# Patient Record
Sex: Male | Born: 1945 | Race: Black or African American | Hispanic: No | Marital: Single | Smoking: Never smoker
Health system: Southern US, Community
[De-identification: ages and names within clinical notes are randomized; demographics above are authoritative.]

## PROBLEM LIST (undated history)

## (undated) DIAGNOSIS — I10 Essential (primary) hypertension: Secondary | ICD-10-CM

## (undated) DIAGNOSIS — Z87442 Personal history of urinary calculi: Secondary | ICD-10-CM

## (undated) DIAGNOSIS — R5381 Other malaise: Secondary | ICD-10-CM

## (undated) DIAGNOSIS — R5383 Other fatigue: Secondary | ICD-10-CM

## (undated) DIAGNOSIS — D170 Benign lipomatous neoplasm of skin and subcutaneous tissue of head, face and neck: Secondary | ICD-10-CM

## (undated) DIAGNOSIS — K635 Polyp of colon: Secondary | ICD-10-CM

## (undated) DIAGNOSIS — M1990 Unspecified osteoarthritis, unspecified site: Secondary | ICD-10-CM

## (undated) DIAGNOSIS — R972 Elevated prostate specific antigen [PSA]: Secondary | ICD-10-CM

## (undated) DIAGNOSIS — N400 Enlarged prostate without lower urinary tract symptoms: Secondary | ICD-10-CM

## (undated) DIAGNOSIS — H269 Unspecified cataract: Secondary | ICD-10-CM

## (undated) DIAGNOSIS — D369 Benign neoplasm, unspecified site: Secondary | ICD-10-CM

## (undated) DIAGNOSIS — C61 Malignant neoplasm of prostate: Secondary | ICD-10-CM

## (undated) DIAGNOSIS — F419 Anxiety disorder, unspecified: Secondary | ICD-10-CM

## (undated) HISTORY — PX: TONSILLECTOMY: SUR1361

## (undated) HISTORY — DX: Benign prostatic hyperplasia without lower urinary tract symptoms: N40.0

## (undated) HISTORY — DX: Other malaise: R53.81

## (undated) HISTORY — DX: Anxiety disorder, unspecified: F41.9

## (undated) HISTORY — DX: Other fatigue: R53.83

## (undated) HISTORY — DX: Essential (primary) hypertension: I10

## (undated) HISTORY — DX: Elevated prostate specific antigen (PSA): R97.20

## (undated) HISTORY — DX: Polyp of colon: K63.5

## (undated) HISTORY — DX: Benign neoplasm, unspecified site: D36.9

## (undated) HISTORY — DX: Benign lipomatous neoplasm of skin and subcutaneous tissue of head, face and neck: D17.0

---

## 1987-02-12 HISTORY — PX: KNEE ARTHROCENTESIS: SUR44

## 1998-01-03 ENCOUNTER — Emergency Department (HOSPITAL_COMMUNITY): Admission: EM | Admit: 1998-01-03 | Discharge: 1998-01-03 | Payer: Self-pay | Admitting: Emergency Medicine

## 2000-01-08 ENCOUNTER — Encounter (INDEPENDENT_AMBULATORY_CARE_PROVIDER_SITE_OTHER): Payer: Self-pay | Admitting: *Deleted

## 2000-01-08 ENCOUNTER — Ambulatory Visit (HOSPITAL_COMMUNITY): Admission: RE | Admit: 2000-01-08 | Discharge: 2000-01-08 | Payer: Self-pay | Admitting: *Deleted

## 2002-03-26 ENCOUNTER — Encounter: Payer: Self-pay | Admitting: Cardiology

## 2002-03-26 ENCOUNTER — Ambulatory Visit (HOSPITAL_COMMUNITY): Admission: RE | Admit: 2002-03-26 | Discharge: 2002-03-26 | Payer: Self-pay | Admitting: Cardiology

## 2005-08-26 ENCOUNTER — Emergency Department (HOSPITAL_COMMUNITY): Admission: EM | Admit: 2005-08-26 | Discharge: 2005-08-26 | Payer: Self-pay | Admitting: Emergency Medicine

## 2005-08-27 ENCOUNTER — Ambulatory Visit: Payer: Self-pay | Admitting: Internal Medicine

## 2005-10-10 ENCOUNTER — Ambulatory Visit: Payer: Self-pay | Admitting: Internal Medicine

## 2005-10-25 ENCOUNTER — Ambulatory Visit: Payer: Self-pay | Admitting: Internal Medicine

## 2006-02-25 ENCOUNTER — Ambulatory Visit: Payer: Self-pay | Admitting: Internal Medicine

## 2006-02-25 LAB — CONVERTED CEMR LAB: HCV Ab: NEGATIVE

## 2006-03-07 ENCOUNTER — Ambulatory Visit: Payer: Self-pay | Admitting: Internal Medicine

## 2008-02-12 HISTORY — PX: PROSTATE SURGERY: SHX751

## 2008-11-25 ENCOUNTER — Encounter (INDEPENDENT_AMBULATORY_CARE_PROVIDER_SITE_OTHER): Payer: Self-pay | Admitting: Urology

## 2008-11-25 ENCOUNTER — Ambulatory Visit (HOSPITAL_BASED_OUTPATIENT_CLINIC_OR_DEPARTMENT_OTHER): Admission: RE | Admit: 2008-11-25 | Discharge: 2008-11-25 | Payer: Self-pay | Admitting: Urology

## 2009-12-13 ENCOUNTER — Encounter: Admission: RE | Admit: 2009-12-13 | Discharge: 2009-12-13 | Payer: Self-pay | Admitting: Orthopedic Surgery

## 2010-05-17 LAB — POCT I-STAT 4, (NA,K, GLUC, HGB,HCT)
Glucose, Bld: 111 mg/dL — ABNORMAL HIGH (ref 70–99)
HCT: 47 % (ref 39.0–52.0)
Hemoglobin: 16 g/dL (ref 13.0–17.0)
Potassium: 4.2 mEq/L (ref 3.5–5.1)
Sodium: 140 mEq/L (ref 135–145)

## 2010-05-17 LAB — URINALYSIS, ROUTINE W REFLEX MICROSCOPIC
Bilirubin Urine: NEGATIVE
Glucose, UA: NEGATIVE mg/dL
Hgb urine dipstick: NEGATIVE
Ketones, ur: NEGATIVE mg/dL
Nitrite: NEGATIVE
Protein, ur: NEGATIVE mg/dL
Specific Gravity, Urine: 1.017 (ref 1.005–1.030)
Urobilinogen, UA: 0.2 mg/dL (ref 0.0–1.0)
pH: 7.5 (ref 5.0–8.0)

## 2010-06-29 NOTE — Cardiovascular Report (Signed)
   NAME:  Danny Cobb, Danny Cobb                      ACCOUNT NO.:  [id number]   MEDICAL RECORD NO.:  [id number]                   PATIENT TYPE:  OIB   LOCATION:  2870                                 FACILITY:  [facility]   PHYSICIAN:  Madaline Savage, M.D.             DATE OF BIRTH:  Dec 25, 1945   DATE OF PROCEDURE:  01/24/2003  DATE OF DISCHARGE:                              CARDIAC CATHETERIZATION   PROCEDURE PERFORMED:  1. Selective coronary angiography by Judkins technique.  2. Retrograde left heart catheterization.  3. Left ventricular angiography.   COMPLICATIONS:  None.   ENTRY SITE:  Right femoral.   DYE USED:  Omnipaque.   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  The  patient has had no arrhythmias documented to delineate what is causing his  palpitation.  He does have hypertension.  He had a Cardiolite stress test  that showed suspicion of anterior wall ischemia, and an echocardiogram was  unremarkable.  The patient presents today for an outpatient cardiac  catheterization to reevaluate the possibility of anterior wall ischemia on  the Cardiolite stress tests.   RESULTS:  1. Pressures     A. The left ventricular pressure was 130/7, end diastolic pressure 14.     B. Central aortic pressure 130/80, mean of 105.     C. No aortic valve gradient by pullback technique.  2. Angiographic results     A. The coronary arteries were entirely normal.  No even mildly        obstructive lesions were seen.     B. The left ventricle showed excellent contractility, ejection fraction        of 60%, with no wall motion abnormalities and no mitral regurgitation.     C. The abdominal aorta was normal.  Both renal arteries appeared normal.        The common iliacs were normal.  Internal and external iliacs were also        unremarkable.   FINAL DIAGNOSIS:  1. Angiographically patent  coronary arteries.  2.     Normal left ventricular systolic function.  3. Normal renal arteries.  4. Normal abdominal aorta.                                               Madaline Savage, M.D.    WHG/MEDQ  D:  03/26/2002  T:  03/26/2002  Job:  [id number]   cc:   Margaretmary Bayley, M.D.  [address], [address]  [place]  [place] [address]  Fax: [phone]   Cardiac Catheterization Lab   [facility]

## 2010-09-12 DIAGNOSIS — D369 Benign neoplasm, unspecified site: Secondary | ICD-10-CM

## 2010-09-12 HISTORY — DX: Benign neoplasm, unspecified site: D36.9

## 2010-09-20 ENCOUNTER — Other Ambulatory Visit: Payer: Self-pay | Admitting: Gastroenterology

## 2010-11-12 DIAGNOSIS — D170 Benign lipomatous neoplasm of skin and subcutaneous tissue of head, face and neck: Secondary | ICD-10-CM

## 2010-11-12 HISTORY — DX: Benign lipomatous neoplasm of skin and subcutaneous tissue of head, face and neck: D17.0

## 2010-12-12 ENCOUNTER — Encounter (INDEPENDENT_AMBULATORY_CARE_PROVIDER_SITE_OTHER): Payer: Self-pay | Admitting: Surgery

## 2010-12-13 ENCOUNTER — Ambulatory Visit (INDEPENDENT_AMBULATORY_CARE_PROVIDER_SITE_OTHER): Payer: Medicare Other | Admitting: Surgery

## 2010-12-13 ENCOUNTER — Encounter (INDEPENDENT_AMBULATORY_CARE_PROVIDER_SITE_OTHER): Payer: Self-pay | Admitting: Surgery

## 2010-12-13 VITALS — BP 132/86 | HR 70 | Temp 97.4°F | Resp 20 | Ht 71.0 in | Wt 229.0 lb

## 2010-12-13 DIAGNOSIS — D1739 Benign lipomatous neoplasm of skin and subcutaneous tissue of other sites: Secondary | ICD-10-CM

## 2010-12-13 DIAGNOSIS — D170 Benign lipomatous neoplasm of skin and subcutaneous tissue of head, face and neck: Secondary | ICD-10-CM

## 2010-12-13 DIAGNOSIS — L723 Sebaceous cyst: Secondary | ICD-10-CM

## 2010-12-13 NOTE — Patient Instructions (Signed)
We will schedule your surgery today.  Stop your aspirin 5 days prior to surgery.

## 2010-12-13 NOTE — Progress Notes (Signed)
Chief Complaint  Patient presents with  . 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  The lesion on his back has been present for several years and was actually "lanced" several years ago, draining some purulence.  This has enlarged again and is occasionally uncomfortable.  The scalp mass was noticed about a year ago and has enlarged significantly.  It is uncomfortable with brushing his hair and with haircuts.   HPI  Past Medical History  Diagnosis Date  . Hypertension   . Colon polyps   . Elevated PSA   . Tubular adenoma 09/2010  . Anxiety   . Other malaise and fatigue   . Hypertrophy of prostate without urinary obstruction and other lower urinary tract symptoms (LUTS)   . Lipoma of neck October 2012    posterior neck lesion     Past Surgical History  Procedure Date  . Prostate surgery 2010    prostate biopsy  . Knee arthrocentesis 1989    left knee     Family History  Problem Relation Age of Onset  . Cancer Mother     Social History History  Substance Use Topics  . Smoking status: Never Smoker   . Smokeless tobacco: Never Used  . Alcohol Use: No    No Known Allergies  Current Outpatient Prescriptions  Medication Sig Dispense Refill  . amLODipine-benazepril (LOTREL) 5-10 MG per capsule Take 1 capsule by mouth daily.        . aspirin 81 MG tablet Take 81 mg by mouth daily.        . ALPRAZolam (XANAX) 0.25 MG tablet Take 0.25 mg by mouth at bedtime as needed.          Review of Systems Review of Systems ROS otherwise negative Blood pressure 132/86, pulse 70, temperature 97.4 F (36.3 C), temperature source Temporal, resp. rate 20, height 5' 11" (1.803 m), weight 229 lb (103.874 kg).  Physical Exam Physical Exam WDWN in NAD HEENT:  EOMI, sclera anicteric Neck:  No masses, no thyromegaly Lungs:  CTA bilaterally; normal  respiratory effort CV:  Regular rate and rhythm; no murmurs Abd:  +bowel sounds, soft, non-tender, no masses Ext:  Well-perfused; no edema Skin:  Warm, dry; no sign of jaundice Scalp: - 4 cm well-demarcated mass left posterior scalp, soft, non-tender; above hair line Back - 3 cm protruding mass in the mid-back; no erythema or drainage Data Reviewed None  Assessment    1.  Probable subcutaneous lipoma - posterior scalp - 4 cm 2.  Sebaceous cyst - mid-back - 3 cm    Plan    Excision of both of these masses from the posterior scalp and back under anesthesia. The surgical procedure has been discussed with the patient.  Potential risks, benefits, alternative treatments, and expected outcomes have been explained.  All of the patient's questions at this time have been answered.  The likelihood of reaching the patient's treatment goal is good.  The patient understand the proposed surgical procedure and wishes to proceed.        Danny Cobb K. 12/13/2010, 9:26 AM    

## 2010-12-18 ENCOUNTER — Encounter (HOSPITAL_COMMUNITY): Payer: Self-pay | Admitting: Pharmacy Technician

## 2010-12-20 ENCOUNTER — Encounter (HOSPITAL_COMMUNITY): Payer: Medicare Other

## 2010-12-20 ENCOUNTER — Encounter (HOSPITAL_COMMUNITY): Payer: Self-pay

## 2010-12-20 VITALS — BP 144/90 | HR 62 | Temp 97.4°F | Resp 16 | Ht 71.0 in

## 2010-12-20 DIAGNOSIS — D170 Benign lipomatous neoplasm of skin and subcutaneous tissue of head, face and neck: Secondary | ICD-10-CM

## 2010-12-20 DIAGNOSIS — I10 Essential (primary) hypertension: Secondary | ICD-10-CM

## 2010-12-20 HISTORY — DX: Essential (primary) hypertension: I10

## 2010-12-20 LAB — SURGICAL PCR SCREEN
MRSA, PCR: NEGATIVE
Staphylococcus aureus: NEGATIVE

## 2010-12-20 LAB — BASIC METABOLIC PANEL
CO2: 28 mEq/L (ref 19–32)
Chloride: 102 mEq/L (ref 96–112)
Sodium: 137 mEq/L (ref 135–145)

## 2010-12-20 NOTE — Progress Notes (Signed)
Quick Note:  This patient may proceed with surgery ______ 

## 2010-12-20 NOTE — Pre-Procedure Instructions (Addendum)
12-20-10 Ekg with chart(11-28-10). Pt. Aware no aspirin  x 5 days prior

## 2010-12-20 NOTE — Patient Instructions (Signed)
20 Danny Cobb  12/20/2010   Your procedure is scheduled on: 12-24-10 Report to [facility] at  1:30 PM.  Call this number if you have problems the morning of surgery: [phone]   Remember:   Do not eat food:After Midnight.  Do not drink clear liquids: 6 Hours before arrival.(black coffee, tea. Soft drinks, apple juice, grape juice, cranberry juice. clear broths, water ) until 09:00am-nothing after.  Take these medicines the morning of surgery with A SIP OF WATER: none at home -Short Stay will give dose Amlodipine 10mg  orally on arrival.(inplace of Lotrel dose   Do not wear jewelry, make-up or nail polish.  Do not wear lotions, powders, or perfumes. You may wear deodorant.  Do not shave 48 hours prior to surgery.  Do not bring valuables to the hospital.  Contacts, dentures or bridgework may not be worn into surgery.  Leave suitcase in the car. After surgery it may be brought to your room.  For patients admitted to the hospital, checkout time is 11:00 AM the day of discharge.   Patients discharged the day of surgery will not be allowed to drive home.  Name and phone number of your driver: [name] HQION,GEXBMW-[phone])  Special Instructions: CHG Shower Use Special Wash: 1/2 bottle night before surgery and 1/2 bottle morning of surgery.   Please read over the following fact sheets that you were given: MRSA Information

## 2010-12-20 NOTE — Pre-Procedure Instructions (Addendum)
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

## 2010-12-24 ENCOUNTER — Encounter (HOSPITAL_COMMUNITY)
Admission: RE | Disposition: A | Discharge status: Home / Self Care | Payer: Self-pay | Source: Ambulatory Visit | Attending: Surgery

## 2010-12-24 ENCOUNTER — Ambulatory Visit (HOSPITAL_COMMUNITY): Payer: Medicare Other | Admitting: Anesthesiology

## 2010-12-24 ENCOUNTER — Other Ambulatory Visit (INDEPENDENT_AMBULATORY_CARE_PROVIDER_SITE_OTHER): Payer: Self-pay | Admitting: Surgery

## 2010-12-24 ENCOUNTER — Ambulatory Visit (HOSPITAL_COMMUNITY)
Admission: RE | Admit: 2010-12-24 | Discharge: 2010-12-24 | Disposition: A | Discharge status: Home / Self Care | Payer: Medicare Other | Source: Ambulatory Visit | Attending: Surgery | Admitting: Surgery

## 2010-12-24 ENCOUNTER — Encounter (HOSPITAL_COMMUNITY): Payer: Self-pay | Admitting: *Deleted

## 2010-12-24 ENCOUNTER — Encounter (HOSPITAL_COMMUNITY): Payer: Self-pay | Admitting: Anesthesiology

## 2010-12-24 DIAGNOSIS — D1739 Benign lipomatous neoplasm of skin and subcutaneous tissue of other sites: Secondary | ICD-10-CM | POA: Insufficient documentation

## 2010-12-24 DIAGNOSIS — F411 Generalized anxiety disorder: Secondary | ICD-10-CM | POA: Insufficient documentation

## 2010-12-24 DIAGNOSIS — L723 Sebaceous cyst: Secondary | ICD-10-CM | POA: Insufficient documentation

## 2010-12-24 DIAGNOSIS — Z860100 Personal history of colon polyps, unspecified: Secondary | ICD-10-CM | POA: Insufficient documentation

## 2010-12-24 DIAGNOSIS — Z79899 Other long term (current) drug therapy: Secondary | ICD-10-CM | POA: Insufficient documentation

## 2010-12-24 DIAGNOSIS — I10 Essential (primary) hypertension: Secondary | ICD-10-CM | POA: Insufficient documentation

## 2010-12-24 DIAGNOSIS — Z7982 Long term (current) use of aspirin: Secondary | ICD-10-CM | POA: Insufficient documentation

## 2010-12-24 DIAGNOSIS — D170 Benign lipomatous neoplasm of skin and subcutaneous tissue of head, face and neck: Secondary | ICD-10-CM

## 2010-12-24 DIAGNOSIS — Z01812 Encounter for preprocedural laboratory examination: Secondary | ICD-10-CM | POA: Insufficient documentation

## 2010-12-24 DIAGNOSIS — Z01818 Encounter for other preprocedural examination: Secondary | ICD-10-CM | POA: Insufficient documentation

## 2010-12-24 DIAGNOSIS — N400 Enlarged prostate without lower urinary tract symptoms: Secondary | ICD-10-CM | POA: Insufficient documentation

## 2010-12-24 HISTORY — PX: MASS EXCISION: SHX2000

## 2010-12-24 SURGERY — EXCISION MASS
Anesthesia: General | Wound class: Clean Contaminated

## 2010-12-24 MED ORDER — FENTANYL CITRATE 0.05 MG/ML IJ SOLN: 25.0000 ug | INTRAMUSCULAR | Status: DC | PRN

## 2010-12-24 MED ORDER — MIDAZOLAM HCL 5 MG/5ML IJ SOLN
INTRAMUSCULAR | Status: DC | PRN
  Administered 2010-12-24: 2 mg via INTRAVENOUS

## 2010-12-24 MED ORDER — MORPHINE SULFATE 2 MG/ML IJ SOLN: 2.0000 mg | INTRAMUSCULAR | Status: DC | PRN

## 2010-12-24 MED ORDER — BUPIVACAINE-EPINEPHRINE 0.25% -1:200000 IJ SOLN
INTRAMUSCULAR | Status: DC | PRN
  Administered 2010-12-24: 5 mL

## 2010-12-24 MED ORDER — ONDANSETRON HCL 4 MG/2ML IJ SOLN
INTRAMUSCULAR | Status: DC | PRN
  Administered 2010-12-24: 4 mg via INTRAVENOUS

## 2010-12-24 MED ORDER — PROMETHAZINE HCL 25 MG/ML IJ SOLN: 6.2500 mg | INTRAMUSCULAR | Status: DC | PRN

## 2010-12-24 MED ORDER — PROPOFOL 10 MG/ML IV EMUL
INTRAVENOUS | Status: DC | PRN
  Administered 2010-12-24 (×2): 50 mg via INTRAVENOUS
  Administered 2010-12-24: 180 mg via INTRAVENOUS

## 2010-12-24 MED ORDER — SODIUM CHLORIDE 0.9 % IR SOLN
Status: DC | PRN
  Administered 2010-12-24: 1000 mL

## 2010-12-24 MED ORDER — BUPIVACAINE-EPINEPHRINE 0.25% -1:200000 IJ SOLN
INTRAMUSCULAR | Status: AC
  Filled 2010-12-24: qty 1

## 2010-12-24 MED ORDER — PHENYLEPHRINE HCL 10 MG/ML IJ SOLN
INTRAMUSCULAR | Status: DC | PRN
  Administered 2010-12-24: 40 ug via INTRAVENOUS

## 2010-12-24 MED ORDER — CEFAZOLIN SODIUM-DEXTROSE 2-3 GM-% IV SOLR
2.0000 g | INTRAVENOUS | Status: AC
  Administered 2010-12-24: 2 g via INTRAVENOUS

## 2010-12-24 MED ORDER — SODIUM CHLORIDE 0.9 % IJ SOLN: 3.0000 mL | INTRAMUSCULAR | Status: DC | PRN

## 2010-12-24 MED ORDER — ONDANSETRON HCL 4 MG/2ML IJ SOLN: 4.0000 mg | INTRAMUSCULAR | Status: DC | PRN

## 2010-12-24 MED ORDER — SUCCINYLCHOLINE CHLORIDE 20 MG/ML IJ SOLN
INTRAMUSCULAR | Status: DC | PRN
  Administered 2010-12-24: 100 mg via INTRAVENOUS

## 2010-12-24 MED ORDER — FENTANYL CITRATE 0.05 MG/ML IJ SOLN
INTRAMUSCULAR | Status: DC | PRN
  Administered 2010-12-24: 50 ug via INTRAVENOUS
  Administered 2010-12-24: 100 ug via INTRAVENOUS
  Administered 2010-12-24 (×2): 50 ug via INTRAVENOUS

## 2010-12-24 MED ORDER — EPHEDRINE SULFATE 50 MG/ML IJ SOLN
INTRAMUSCULAR | Status: DC | PRN
  Administered 2010-12-24: 10 mg via INTRAVENOUS
  Administered 2010-12-24: 5 mg via INTRAVENOUS

## 2010-12-24 MED ORDER — AMLODIPINE BESYLATE 5 MG PO TABS
5.0000 mg | ORAL_TABLET | Freq: Once | ORAL | Status: AC
  Administered 2010-12-24: 5 mg via ORAL
  Filled 2010-12-24: qty 1

## 2010-12-24 MED ORDER — HYDROCODONE-ACETAMINOPHEN 5-325 MG PO TABS: 1.0000 | ORAL_TABLET | ORAL | Status: DC | PRN

## 2010-12-24 MED ORDER — LACTATED RINGERS IV SOLN
INTRAVENOUS | Status: DC | PRN
  Administered 2010-12-24 (×2): via INTRAVENOUS

## 2010-12-24 MED ORDER — CEFAZOLIN SODIUM 1-5 GM-% IV SOLN
INTRAVENOUS | Status: AC
  Filled 2010-12-24: qty 100

## 2010-12-24 MED ORDER — HYDROCODONE-ACETAMINOPHEN 5-325 MG PO TABS: 1.0000 | ORAL_TABLET | ORAL | Status: AC | PRN

## 2010-12-24 SURGICAL SUPPLY — 42 items
APL SKNCLS STERI-STRIP NONHPOA (GAUZE/BANDAGES/DRESSINGS)
BENZOIN TINCTURE PRP APPL 2/3 (GAUZE/BANDAGES/DRESSINGS) ×2 IMPLANT
BLADE HEX COATED 2.75 (ELECTRODE) IMPLANT
BLADE SURG 15 STRL LF DISP TIS (BLADE) ×1 IMPLANT
BLADE SURG 15 STRL SS (BLADE) ×1
CANISTER SUCTION 1200CC (MISCELLANEOUS) IMPLANT
CHLORAPREP W/TINT 26ML (MISCELLANEOUS) ×2 IMPLANT
CLOSURE STERI STRIP 1/2 X4 (GAUZE/BANDAGES/DRESSINGS) ×2 IMPLANT
DECANTER SPIKE VIAL GLASS SM (MISCELLANEOUS) IMPLANT
DERMABOND ADVANCED (GAUZE/BANDAGES/DRESSINGS) ×1
DERMABOND ADVANCED .7 DNX12 (GAUZE/BANDAGES/DRESSINGS) ×1 IMPLANT
DRAPE UTILITY W/TAPE 26X15 (DRAPES) ×2 IMPLANT
DRAPE UTILITY XL STRL (DRAPES) ×2 IMPLANT
DRSG OPSITE 6X11 MED (GAUZE/BANDAGES/DRESSINGS) ×2 IMPLANT
ELECT COATED BLADE 2.86 ST (ELECTRODE) IMPLANT
ELECT REM PT RETURN 9FT ADLT (ELECTROSURGICAL) ×2
ELECTRODE REM PT RTRN 9FT ADLT (ELECTROSURGICAL) ×1 IMPLANT
GAUZE PACKING IODOFORM 1/4X5 (PACKING) IMPLANT
GLOVE BIO SURGEON STRL SZ7 (GLOVE) ×2 IMPLANT
GLOVE BIOGEL PI IND STRL 7.5 (GLOVE) ×3 IMPLANT
GLOVE BIOGEL PI INDICATOR 7.5 (GLOVE) ×3
GLOVE SURG SS PI 7.0 STRL IVOR (GLOVE) ×4 IMPLANT
GOWN STRL NON-REIN LRG LVL3 (GOWN DISPOSABLE) ×4 IMPLANT
GOWN STRL REIN XL XLG (GOWN DISPOSABLE) ×6 IMPLANT
NEEDLE HYPO 25X1 1.5 SAFETY (NEEDLE) ×2 IMPLANT
NS IRRIG 1000ML POUR BTL (IV SOLUTION) IMPLANT
PACK GENERAL/GYN (CUSTOM PROCEDURE TRAY) ×2 IMPLANT
SPONGE GAUZE 4X4 12PLY (GAUZE/BANDAGES/DRESSINGS) ×2 IMPLANT
SPONGE LAP 4X18 X RAY DECT (DISPOSABLE) ×4 IMPLANT
STRIP CLOSURE SKIN 1/2X4 (GAUZE/BANDAGES/DRESSINGS) IMPLANT
SUT ETHILON 2 0 FS 18 (SUTURE) IMPLANT
SUT ETHILON 4 0 PS 2 18 (SUTURE) IMPLANT
SUT MNCRL AB 4-0 PS2 18 (SUTURE) IMPLANT
SUT SILK 2 0 SH (SUTURE) IMPLANT
SUT VIC AB 2-0 SH 27 (SUTURE)
SUT VIC AB 2-0 SH 27XBRD (SUTURE) IMPLANT
SUT VIC AB 4-0 SH 18 (SUTURE) IMPLANT
SUT VICRYL 3-0 CR8 SH (SUTURE) ×2 IMPLANT
SUT VICRYL 4-0 PS2 18IN ABS (SUTURE) IMPLANT
SYR CONTROL 10ML LL (SYRINGE) ×2 IMPLANT
TOWEL OR 17X26 10 PK STRL BLUE (TOWEL DISPOSABLE) ×2 IMPLANT
WATER STERILE IRR 1000ML POUR (IV SOLUTION) ×2 IMPLANT

## 2010-12-24 NOTE — Anesthesia Preprocedure Evaluation (Addendum)
Anesthesia Evaluation  Patient identified by MRN, date of birth, ID band Patient awake    Reviewed: Allergy & Precautions, H&P , NPO status , Patient's Chart, lab work & pertinent test results  History of Anesthesia Complications Negative for: history of anesthetic complications  Airway       Dental  (+)    Pulmonary neg pulmonary ROS,    Pulmonary exam normal       Cardiovascular Pt. on medications neg cardio ROS     Neuro/Psych Negative Neurological ROS  Negative Psych ROS   GI/Hepatic negative GI ROS, Neg liver ROS,   Endo/Other  Negative Endocrine ROS  Renal/GU negative Renal ROS  Genitourinary negative   Musculoskeletal negative musculoskeletal ROS (+)   Abdominal Normal abdominal exam  (+)   Peds negative pediatric ROS (+)  Hematology negative hematology ROS (+)   Anesthesia Other Findings   Reproductive/Obstetrics negative OB ROS                          Anesthesia Physical Anesthesia Plan  ASA: II  Anesthesia Plan: General   Post-op Pain Management:    Induction: Intravenous  Airway Management Planned: Oral ETT  Additional Equipment:   Intra-op Plan:   Post-operative Plan: Extubation in OR  Informed Consent: I have reviewed the patients History and Physical, chart, labs and discussed the procedure including the risks, benefits and alternatives for the proposed anesthesia with the patient or authorized representative who has indicated his/her understanding and acceptance.   Dental advisory given  Plan Discussed with: CRNA and Surgeon  Anesthesia Plan Comments:         Anesthesia Quick Evaluation

## 2010-12-24 NOTE — Preoperative (Signed)
Beta Blockers   Reason not to administer Beta Blockers:Not Applicable 

## 2010-12-24 NOTE — Anesthesia Postprocedure Evaluation (Signed)
  Anesthesia Post-op Note  Patient: Danny Cobb  Procedure(s) Performed:  EXCISION MASS - Excision of Sebaceous Masses  Patient Location: PACU  Anesthesia Type: General  Level of Consciousness: awake and alert   Airway and Oxygen Therapy: Patient Spontanous Breathing  Post-op Pain: mild  Post-op Assessment: Post-op Vital signs reviewed, Patient's Cardiovascular Status Stable, Respiratory Function Stable, Patent Airway and No signs of Nausea or vomiting  Post-op Vital Signs: stable  Complications: No apparent anesthesia complications

## 2010-12-24 NOTE — Transfer of Care (Signed)
Immediate Anesthesia Transfer of Care Note  Patient: Danny Cobb  Procedure(s) Performed:  EXCISION MASS - Excision of Sebaceous Masses  Patient Location: PACU  Anesthesia Type: General  Level of Consciousness: sedated and responds to stimulation  Airway & Oxygen Therapy: Patient Spontanous Breathing and Patient connected to face mask oxygen  Post-op Assessment: Report given to PACU RN, Post -op Vital signs reviewed and stable and Patient moving all extremities X 4  Post vital signs: Reviewed and stable  Complications: No apparent anesthesia complications

## 2010-12-24 NOTE — H&P (View-Only) (Signed)
Chief Complaint  Patient presents with  . 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  The lesion on his back has been present for several years and was actually "lanced" several years ago, draining some purulence.  This has enlarged again and is occasionally uncomfortable.  The scalp mass was noticed about a year ago and has enlarged significantly.  It is uncomfortable with brushing his hair and with haircuts.   HPI  Past Medical History  Diagnosis Date  . Hypertension   . Colon polyps   . Elevated PSA   . Tubular adenoma 09/2010  . Anxiety   . Other malaise and fatigue   . Hypertrophy of prostate without urinary obstruction and other lower urinary tract symptoms (LUTS)   . Lipoma of neck October 2012    posterior neck lesion     Past Surgical History  Procedure Date  . Prostate surgery 2010    prostate biopsy  . Knee arthrocentesis 1989    left knee     Family History  Problem Relation Age of Onset  . Cancer Mother     Social History History  Substance Use Topics  . Smoking status: Never Smoker   . Smokeless tobacco: Never Used  . Alcohol Use: No    No Known Allergies  Current Outpatient Prescriptions  Medication Sig Dispense Refill  . amLODipine-benazepril (LOTREL) 5-10 MG per capsule Take 1 capsule by mouth daily.        Marland Kitchen aspirin 81 MG tablet Take 81 mg by mouth daily.        Marland Kitchen ALPRAZolam (XANAX) 0.25 MG tablet Take 0.25 mg by mouth at bedtime as needed.          Review of Systems Review of Systems ROS otherwise negative Blood pressure 132/86, pulse 70, temperature 97.4 F (36.3 C), temperature source Temporal, resp. rate 20, height 5\' 11"  (1.803 m), weight 229 lb (103.874 kg).  Physical Exam Physical Exam WDWN in NAD HEENT:  EOMI, sclera anicteric Neck:  No masses, no thyromegaly Lungs:  CTA bilaterally; normal  respiratory effort CV:  Regular rate and rhythm; no murmurs Abd:  +bowel sounds, soft, non-tender, no masses Ext:  Well-perfused; no edema Skin:  Warm, dry; no sign of jaundice Scalp: - 4 cm well-demarcated mass left posterior scalp, soft, non-tender; above hair line Back - 3 cm protruding mass in the mid-back; no erythema or drainage Data Reviewed None  Assessment    1.  Probable subcutaneous lipoma - posterior scalp - 4 cm 2.  Sebaceous cyst - mid-back - 3 cm    Plan    Excision of both of these masses from the posterior scalp and back under anesthesia. The surgical procedure has been discussed with the patient.  Potential risks, benefits, alternative treatments, and expected outcomes have been explained.  All of the patient's questions at this time have been answered.  The likelihood of reaching the patient's treatment goal is good.  The patient understand the proposed surgical procedure and wishes to proceed.        Jerris Fleer K. 12/13/2010, 9:26 AM

## 2010-12-24 NOTE — Interval H&P Note (Signed)
History and Physical Interval Note:   12/24/2010   7:32 AM   Danny Cobb  has presented today for surgery, with the diagnosis of lipoma scalp 4 cm  Sebaceous cyst 3 cm  The various methods of treatment have been discussed with the patient and family. After consideration of risks, benefits and other options for treatment, the patient has consented to  Procedure(s): EXCISION MASS as a surgical intervention .  The patients' history has been reviewed, patient examined, no change in status, stable for surgery.  I have reviewed the patients' chart and labs.  Questions were answered to the patient's satisfaction.     [name]  MD

## 2010-12-24 NOTE — Op Note (Signed)
Indications:  The patient presented with a history of an enlarging mass on the left posterior scalp measuring about 4 cm and a 3 cm enlarging mass in the midback. Pre-operative diagnosis:  1.  Lipoma - left posterior scalp     2.  Sebaceous cyst - mid-back  Post-operative diagnosis:  Same  Surgeon: Rosmery Duggin K.   Assistants: None  Anesthesia: General endotracheal anesthesia  ASA Class: 2   Procedure Details  The patient was brought to the operating room and placed in a supine position on the stretcher.  After an adequate level of anesthesia was obtained, he was rotated to a prone position with appropriate padding.  His back and posterior scalp were prepped with Betadine and draped in sterile fashion.  A timeout was taken to ensure the proper patient and proper procedure.  We infiltrated the area around the cyst in the mid-back with .25% Marcaine with epinephrine.  We made a transverse incision and dissected down to the cyst.  We dissected around the cyst.  As we were removing the cyst, the cyst ruptured, but we were able to remove the entire cyst wall.  We irrigated the wound thoroughly and closed with a deep layer of 3-0 Vicryl.  The skin was closed with a subcuticular layer of 4-0 Monocryl.    The area over the scalp lipoma was also infiltrated with local anesthetic.  We made a transverse incision and dissected down to the mass.  We removed the entire mass and sent this for pathologic examination.  The wound was closed in similar fashion.  The scalp incision was sealed with Dermabond.  The back incision was dressed with Benzoin and steri-strips.  T  The patient was extubated and brought to the recovery room in stable condition.  All sponge, instrument, and needle counts are correct.   Estimated Blood Loss: Minimal          Complications: None; patient tolerated the procedure well.         Disposition: PACU - hemodynamically stable.         Condition: stable

## 2010-12-27 ENCOUNTER — Encounter (HOSPITAL_COMMUNITY): Payer: Self-pay | Admitting: Surgery

## 2011-01-28 ENCOUNTER — Encounter (INDEPENDENT_AMBULATORY_CARE_PROVIDER_SITE_OTHER): Payer: Self-pay | Admitting: Surgery

## 2011-01-28 ENCOUNTER — Ambulatory Visit (INDEPENDENT_AMBULATORY_CARE_PROVIDER_SITE_OTHER): Payer: Medicare Other | Admitting: Surgery

## 2011-01-28 VITALS — BP 108/86 | HR 64 | Temp 97.8°F | Resp 16 | Ht 71.0 in | Wt 225.4 lb

## 2011-01-28 DIAGNOSIS — D1739 Benign lipomatous neoplasm of skin and subcutaneous tissue of other sites: Secondary | ICD-10-CM

## 2011-01-28 DIAGNOSIS — L723 Sebaceous cyst: Secondary | ICD-10-CM

## 2011-01-28 DIAGNOSIS — D170 Benign lipomatous neoplasm of skin and subcutaneous tissue of head, face and neck: Secondary | ICD-10-CM

## 2011-01-28 NOTE — Progress Notes (Signed)
S/p excision of a sebaceous cyst of the back and a 4 cm lipoma of the posterior scalp on 12/24/10.  The back incision is well-healed with no sign of infection.  He has a small seroma under his lipoma excision site.  This should resolve over time.  Follow-up PRN.  Wilmon Arms. Corliss Skains, MD, [facility]  01/28/2011 10:53 AM

## 2011-02-07 ENCOUNTER — Encounter (INDEPENDENT_AMBULATORY_CARE_PROVIDER_SITE_OTHER): Payer: Self-pay

## 2011-04-03 ENCOUNTER — Other Ambulatory Visit (INDEPENDENT_AMBULATORY_CARE_PROVIDER_SITE_OTHER): Payer: Self-pay | Admitting: Surgery

## 2012-12-25 ENCOUNTER — Other Ambulatory Visit (HOSPITAL_COMMUNITY): Payer: Self-pay | Admitting: Urology

## 2012-12-25 DIAGNOSIS — R972 Elevated prostate specific antigen [PSA]: Secondary | ICD-10-CM

## 2013-01-06 ENCOUNTER — Ambulatory Visit (HOSPITAL_COMMUNITY)
Admission: RE | Admit: 2013-01-06 | Discharge: 2013-01-06 | Disposition: A | Discharge status: Home / Self Care | Payer: Medicare Other | Source: Ambulatory Visit | Attending: Urology | Admitting: Urology

## 2013-01-06 DIAGNOSIS — R972 Elevated prostate specific antigen [PSA]: Secondary | ICD-10-CM | POA: Insufficient documentation

## 2013-01-06 DIAGNOSIS — N402 Nodular prostate without lower urinary tract symptoms: Secondary | ICD-10-CM | POA: Insufficient documentation

## 2013-01-06 DIAGNOSIS — N400 Enlarged prostate without lower urinary tract symptoms: Secondary | ICD-10-CM | POA: Insufficient documentation

## 2013-01-06 LAB — CREATININE, SERUM
Creatinine, Ser: 1.07 mg/dL (ref 0.50–1.35)
GFR calc Af Amer: 80 mL/min — ABNORMAL LOW (ref >90)
GFR calc non Af Amer: 69 mL/min — ABNORMAL LOW (ref >90)

## 2013-01-06 MED ORDER — GADOBENATE DIMEGLUMINE 529 MG/ML IV SOLN
20.0000 mL | Freq: Once | INTRAVENOUS | Status: AC | PRN
  Administered 2013-01-06: 20 mL via INTRAVENOUS

## 2014-01-04 ENCOUNTER — Other Ambulatory Visit: Payer: Self-pay | Admitting: Gastroenterology

## 2016-02-02 ENCOUNTER — Other Ambulatory Visit: Payer: Self-pay | Admitting: Orthopedic Surgery

## 2016-02-21 ENCOUNTER — Other Ambulatory Visit (HOSPITAL_COMMUNITY): Payer: Self-pay | Admitting: *Deleted

## 2016-02-21 ENCOUNTER — Encounter (HOSPITAL_COMMUNITY): Payer: Self-pay

## 2016-02-21 NOTE — Pre-Procedure Instructions (Addendum)
    Danny Cobb  02/21/2016     Your procedure is scheduled on Friday, March 01, 2016               Report to [facility] Entrance "A" Admitting Office at 8:30 AM.             (posted surgery time 10:30 am - 1:00 pm)   Call this number if you have problems the morning of surgery: [phone]   Questions prior to day of surgery, please call [phone] between 8 & 4 PM.   Remember:  Do not eat food or drink liquids after midnight Thursday, 02/29/16.  Take these medicines the morning of surgery with A SIP OF WATER: Tylenol - if needed  Stop Aspirin 5 days prior to surgery unless told differently by physician/surgeon.   Do not wear jewelry.  Do not wear lotions, powders, or cologne.   Men may shave face and neck.  Do not bring valuables to the hospital.  [facility] is not responsible for any belongings or valuables.  Contacts, dentures or bridgework may not be worn into surgery.  Leave your suitcase in the car.  After surgery it may be brought to your room.  For patients admitted to the hospital, discharge time will be determined by your treatment team.  Special instructions:  See "Preparing for Surgery" Instruction sheet.  Please read over the fact sheets that you were given.

## 2016-02-22 ENCOUNTER — Encounter (HOSPITAL_COMMUNITY)
Admission: RE | Admit: 2016-02-22 | Discharge: 2016-02-22 | Disposition: A | Discharge status: Home / Self Care | Payer: Medicare Other | Source: Ambulatory Visit | Attending: Orthopedic Surgery | Admitting: Orthopedic Surgery

## 2016-02-22 ENCOUNTER — Ambulatory Visit (HOSPITAL_COMMUNITY)
Admission: RE | Admit: 2016-02-22 | Discharge: 2016-02-22 | Disposition: A | Discharge status: Home / Self Care | Payer: Medicare Other | Source: Ambulatory Visit | Attending: Orthopedic Surgery | Admitting: Orthopedic Surgery

## 2016-02-22 ENCOUNTER — Encounter (HOSPITAL_COMMUNITY): Payer: Self-pay

## 2016-02-22 DIAGNOSIS — I10 Essential (primary) hypertension: Secondary | ICD-10-CM | POA: Insufficient documentation

## 2016-02-22 DIAGNOSIS — R001 Bradycardia, unspecified: Secondary | ICD-10-CM | POA: Insufficient documentation

## 2016-02-22 DIAGNOSIS — Z01818 Encounter for other preprocedural examination: Secondary | ICD-10-CM | POA: Diagnosis present

## 2016-02-22 DIAGNOSIS — I700 Atherosclerosis of aorta: Secondary | ICD-10-CM | POA: Insufficient documentation

## 2016-02-22 DIAGNOSIS — Z01810 Encounter for preprocedural cardiovascular examination: Secondary | ICD-10-CM | POA: Diagnosis not present

## 2016-02-22 HISTORY — DX: Unspecified osteoarthritis, unspecified site: M19.90

## 2016-02-22 HISTORY — DX: Unspecified cataract: H26.9

## 2016-02-22 HISTORY — DX: Personal history of urinary calculi: Z87.442

## 2016-02-22 LAB — COMPREHENSIVE METABOLIC PANEL
ALT: 25 U/L (ref 17–63)
ANION GAP: 8 (ref 5–15)
AST: 22 U/L (ref 15–41)
Albumin: 4.1 g/dL (ref 3.5–5.0)
Alkaline Phosphatase: 67 U/L (ref 38–126)
BUN: 12 mg/dL (ref 6–20)
CHLORIDE: 106 mmol/L (ref 101–111)
CO2: 25 mmol/L (ref 22–32)
CREATININE: 0.99 mg/dL (ref 0.61–1.24)
Calcium: 9.6 mg/dL (ref 8.9–10.3)
Glucose, Bld: 112 mg/dL — ABNORMAL HIGH (ref 65–99)
POTASSIUM: 4 mmol/L (ref 3.5–5.1)
SODIUM: 139 mmol/L (ref 135–145)
Total Bilirubin: 0.7 mg/dL (ref 0.3–1.2)
Total Protein: 7.1 g/dL (ref 6.5–8.1)

## 2016-02-22 LAB — CBC WITH DIFFERENTIAL/PLATELET
BASOS ABS: 0 10*3/uL (ref 0.0–0.1)
Basophils Relative: 1 %
EOS ABS: 0.2 10*3/uL (ref 0.0–0.7)
EOS PCT: 3 %
HCT: 45.1 % (ref 39.0–52.0)
Hemoglobin: 15.5 g/dL (ref 13.0–17.0)
LYMPHS ABS: 2.3 10*3/uL (ref 0.7–4.0)
Lymphocytes Relative: 30 %
MCH: 31.8 pg (ref 26.0–34.0)
MCHC: 34.4 g/dL (ref 30.0–36.0)
MCV: 92.4 fL (ref 78.0–100.0)
MONO ABS: 0.6 10*3/uL (ref 0.1–1.0)
Monocytes Relative: 8 %
Neutro Abs: 4.5 10*3/uL (ref 1.7–7.7)
Neutrophils Relative %: 58 %
PLATELETS: 231 10*3/uL (ref 150–400)
RBC: 4.88 MIL/uL (ref 4.22–5.81)
RDW: 13.8 % (ref 11.5–15.5)
WBC: 7.6 10*3/uL (ref 4.0–10.5)

## 2016-02-22 LAB — TYPE AND SCREEN
ABO/RH(D): A POS
ANTIBODY SCREEN: NEGATIVE

## 2016-02-22 LAB — URINALYSIS, ROUTINE W REFLEX MICROSCOPIC
BILIRUBIN URINE: NEGATIVE
GLUCOSE, UA: NEGATIVE mg/dL
HGB URINE DIPSTICK: NEGATIVE
KETONES UR: NEGATIVE mg/dL
Leukocytes, UA: NEGATIVE
Nitrite: NEGATIVE
PH: 5 (ref 5.0–8.0)
PROTEIN: NEGATIVE mg/dL
Specific Gravity, Urine: 1.021 (ref 1.005–1.030)

## 2016-02-22 LAB — PROTIME-INR
INR: 1.06
PROTHROMBIN TIME: 13.8 s (ref 11.4–15.2)

## 2016-02-22 LAB — SURGICAL PCR SCREEN
MRSA, PCR: NEGATIVE
Staphylococcus aureus: POSITIVE — AB

## 2016-02-22 LAB — ABO/RH: ABO/RH(D): A POS

## 2016-02-22 NOTE — Progress Notes (Signed)
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  Has denied any cardiac issues or problems.  Has not been back to see a cardio.

## 2016-03-01 ENCOUNTER — Encounter (HOSPITAL_COMMUNITY)
Admission: RE | Disposition: A | Discharge status: Home Health | Payer: Self-pay | Source: Ambulatory Visit | Attending: Orthopedic Surgery

## 2016-03-01 ENCOUNTER — Inpatient Hospital Stay (HOSPITAL_COMMUNITY): Payer: Medicare Other | Admitting: Certified Registered Nurse Anesthetist

## 2016-03-01 ENCOUNTER — Encounter (HOSPITAL_COMMUNITY): Payer: Self-pay | Admitting: Certified Registered Nurse Anesthetist

## 2016-03-01 ENCOUNTER — Inpatient Hospital Stay (HOSPITAL_COMMUNITY)
Admission: RE | Admit: 2016-03-01 | Discharge: 2016-03-03 | DRG: 470 | Disposition: A | Discharge status: Home Health | Payer: Medicare Other | Source: Ambulatory Visit | Attending: Orthopedic Surgery | Admitting: Orthopedic Surgery

## 2016-03-01 DIAGNOSIS — N401 Enlarged prostate with lower urinary tract symptoms: Secondary | ICD-10-CM | POA: Diagnosis present

## 2016-03-01 DIAGNOSIS — R338 Other retention of urine: Secondary | ICD-10-CM | POA: Diagnosis present

## 2016-03-01 DIAGNOSIS — R262 Difficulty in walking, not elsewhere classified: Secondary | ICD-10-CM

## 2016-03-01 DIAGNOSIS — Z6835 Body mass index (BMI) 35.0-35.9, adult: Secondary | ICD-10-CM

## 2016-03-01 DIAGNOSIS — M25662 Stiffness of left knee, not elsewhere classified: Secondary | ICD-10-CM

## 2016-03-01 DIAGNOSIS — M1712 Unilateral primary osteoarthritis, left knee: Secondary | ICD-10-CM | POA: Diagnosis present

## 2016-03-01 DIAGNOSIS — I10 Essential (primary) hypertension: Secondary | ICD-10-CM | POA: Diagnosis present

## 2016-03-01 HISTORY — PX: TOTAL KNEE ARTHROPLASTY: SHX125

## 2016-03-01 SURGERY — ARTHROPLASTY, KNEE, TOTAL
Anesthesia: Spinal | Laterality: Left

## 2016-03-01 MED ORDER — ONDANSETRON HCL 4 MG/2ML IJ SOLN
INTRAMUSCULAR | Status: DC | PRN
  Administered 2016-03-01: 4 mg via INTRAVENOUS

## 2016-03-01 MED ORDER — PHENYLEPHRINE 40 MCG/ML (10ML) SYRINGE FOR IV PUSH (FOR BLOOD PRESSURE SUPPORT)
PREFILLED_SYRINGE | INTRAVENOUS | Status: AC
Start: 2016-03-01 — End: 2016-03-01
  Filled 2016-03-01: qty 10

## 2016-03-01 MED ORDER — ASPIRIN EC 325 MG PO TBEC: 325.0000 mg | DELAYED_RELEASE_TABLET | Freq: Two times a day (BID) | ORAL | 0 refills | Status: DC

## 2016-03-01 MED ORDER — METHOCARBAMOL 500 MG PO TABS
500.0000 mg | ORAL_TABLET | Freq: Four times a day (QID) | ORAL | Status: DC | PRN
  Administered 2016-03-01 – 2016-03-03 (×4): 500 mg via ORAL
  Filled 2016-03-01 (×3): qty 1

## 2016-03-01 MED ORDER — FENTANYL CITRATE (PF) 100 MCG/2ML IJ SOLN
50.0000 ug | INTRAMUSCULAR | Status: DC | PRN
  Administered 2016-03-01: 50 ug via INTRAVENOUS

## 2016-03-01 MED ORDER — OXYCODONE HCL 5 MG PO TABS
ORAL_TABLET | ORAL | Status: AC
  Filled 2016-03-01: qty 2

## 2016-03-01 MED ORDER — BISACODYL 5 MG PO TBEC: 5.0000 mg | DELAYED_RELEASE_TABLET | Freq: Every day | ORAL | Status: DC | PRN

## 2016-03-01 MED ORDER — PROPOFOL 500 MG/50ML IV EMUL
INTRAVENOUS | Status: DC | PRN
  Administered 2016-03-01: 100 ug/kg/min via INTRAVENOUS

## 2016-03-01 MED ORDER — HYDROMORPHONE HCL 1 MG/ML IJ SOLN
INTRAMUSCULAR | Status: AC
  Administered 2016-03-01: 0.5 mg via INTRAVENOUS
  Filled 2016-03-01: qty 0.5

## 2016-03-01 MED ORDER — SODIUM CHLORIDE 0.9 % IJ SOLN
INTRAMUSCULAR | Status: DC | PRN
Start: 2016-03-01 — End: 2016-03-01
  Administered 2016-03-01: 20 mL via INTRAVENOUS

## 2016-03-01 MED ORDER — ROPIVACAINE HCL 7.5 MG/ML IJ SOLN
INTRAMUSCULAR | Status: DC | PRN
  Administered 2016-03-01: 20 mL via PERINEURAL

## 2016-03-01 MED ORDER — CEFAZOLIN SODIUM-DEXTROSE 2-4 GM/100ML-% IV SOLN
2.0000 g | Freq: Four times a day (QID) | INTRAVENOUS | Status: AC
  Administered 2016-03-01 – 2016-03-02 (×2): 2 g via INTRAVENOUS
  Filled 2016-03-01 (×3): qty 100

## 2016-03-01 MED ORDER — SODIUM CHLORIDE 0.9 % IR SOLN
Status: DC | PRN
  Administered 2016-03-01: 3000 mL

## 2016-03-01 MED ORDER — ONDANSETRON HCL 4 MG/2ML IJ SOLN: 4.0000 mg | Freq: Four times a day (QID) | INTRAMUSCULAR | Status: DC | PRN

## 2016-03-01 MED ORDER — OXYCODONE-ACETAMINOPHEN 5-325 MG PO TABS: 1.0000 | ORAL_TABLET | Freq: Four times a day (QID) | ORAL | 0 refills | Status: DC | PRN

## 2016-03-01 MED ORDER — MIDAZOLAM HCL 2 MG/2ML IJ SOLN
1.0000 mg | INTRAMUSCULAR | Status: DC | PRN
  Administered 2016-03-01: 1 mg via INTRAVENOUS

## 2016-03-01 MED ORDER — DEXTROSE 5 % IV SOLN
500.0000 mg | Freq: Four times a day (QID) | INTRAVENOUS | Status: DC | PRN
  Filled 2016-03-01: qty 5

## 2016-03-01 MED ORDER — MIDAZOLAM HCL 2 MG/2ML IJ SOLN
INTRAMUSCULAR | Status: AC
  Administered 2016-03-01: 1 mg via INTRAVENOUS
  Filled 2016-03-01: qty 2

## 2016-03-01 MED ORDER — BUPIVACAINE LIPOSOME 1.3 % IJ SUSP
20.0000 mL | INTRAMUSCULAR | Status: DC
  Filled 2016-03-01: qty 20

## 2016-03-01 MED ORDER — PHENYLEPHRINE 40 MCG/ML (10ML) SYRINGE FOR IV PUSH (FOR BLOOD PRESSURE SUPPORT)
PREFILLED_SYRINGE | INTRAVENOUS | Status: DC | PRN
Start: 2016-03-01 — End: 2016-03-01
  Administered 2016-03-01 (×4): 80 ug via INTRAVENOUS
  Administered 2016-03-01: 40 ug via INTRAVENOUS
  Administered 2016-03-01: 120 ug via INTRAVENOUS
  Administered 2016-03-01: 40 ug via INTRAVENOUS
  Administered 2016-03-01: 120 ug via INTRAVENOUS
  Administered 2016-03-01 (×2): 40 ug via INTRAVENOUS
  Administered 2016-03-01: 80 ug via INTRAVENOUS

## 2016-03-01 MED ORDER — HYDROMORPHONE HCL 1 MG/ML IJ SOLN
INTRAMUSCULAR | Status: AC
Start: 2016-03-01 — End: 2016-03-02
  Filled 2016-03-01: qty 0.5

## 2016-03-01 MED ORDER — SODIUM CHLORIDE 0.9 % IV SOLN
1000.0000 mg | INTRAVENOUS | Status: AC
  Administered 2016-03-01: 1000 mg via INTRAVENOUS
  Filled 2016-03-01 (×2): qty 10

## 2016-03-01 MED ORDER — BUPIVACAINE LIPOSOME 1.3 % IJ SUSP
INTRAMUSCULAR | Status: DC | PRN
  Administered 2016-03-01: 20 mL

## 2016-03-01 MED ORDER — ACETAMINOPHEN 325 MG PO TABS: 650.0000 mg | ORAL_TABLET | Freq: Four times a day (QID) | ORAL | Status: DC | PRN

## 2016-03-01 MED ORDER — FENTANYL CITRATE (PF) 100 MCG/2ML IJ SOLN
INTRAMUSCULAR | Status: AC
  Administered 2016-03-01: 50 ug via INTRAVENOUS
  Filled 2016-03-01: qty 2

## 2016-03-01 MED ORDER — BENAZEPRIL HCL 5 MG PO TABS
10.0000 mg | ORAL_TABLET | Freq: Every day | ORAL | Status: DC
  Administered 2016-03-01 – 2016-03-03 (×3): 10 mg via ORAL
  Filled 2016-03-01 (×3): qty 2

## 2016-03-01 MED ORDER — SODIUM CHLORIDE 0.9 % IV SOLN
INTRAVENOUS | Status: DC
  Administered 2016-03-01: 18:00:00 via INTRAVENOUS

## 2016-03-01 MED ORDER — PROMETHAZINE HCL 25 MG/ML IJ SOLN: 6.2500 mg | INTRAMUSCULAR | Status: DC | PRN

## 2016-03-01 MED ORDER — CEFAZOLIN SODIUM-DEXTROSE 2-4 GM/100ML-% IV SOLN
2.0000 g | INTRAVENOUS | Status: AC
  Administered 2016-03-01: 2 g via INTRAVENOUS
  Filled 2016-03-01: qty 100

## 2016-03-01 MED ORDER — LACTATED RINGERS IV SOLN: INTRAVENOUS | Status: DC

## 2016-03-01 MED ORDER — AMLODIPINE BESY-BENAZEPRIL HCL 5-10 MG PO CAPS: 1.0000 | ORAL_CAPSULE | Freq: Every day | ORAL | Status: DC

## 2016-03-01 MED ORDER — METHOCARBAMOL 500 MG PO TABS
ORAL_TABLET | ORAL | Status: AC
  Administered 2016-03-01: 500 mg via ORAL
  Filled 2016-03-01: qty 1

## 2016-03-01 MED ORDER — POLYETHYLENE GLYCOL 3350 17 G PO PACK: 17.0000 g | PACK | Freq: Every day | ORAL | Status: DC | PRN

## 2016-03-01 MED ORDER — ONDANSETRON HCL 4 MG PO TABS: 4.0000 mg | ORAL_TABLET | Freq: Four times a day (QID) | ORAL | Status: DC | PRN

## 2016-03-01 MED ORDER — ASPIRIN EC 325 MG PO TBEC
325.0000 mg | DELAYED_RELEASE_TABLET | Freq: Two times a day (BID) | ORAL | Status: DC
  Administered 2016-03-01 – 2016-03-03 (×4): 325 mg via ORAL
  Filled 2016-03-01 (×4): qty 1

## 2016-03-01 MED ORDER — FENTANYL CITRATE (PF) 100 MCG/2ML IJ SOLN
INTRAMUSCULAR | Status: AC
  Filled 2016-03-01: qty 2

## 2016-03-01 MED ORDER — HYDROMORPHONE HCL 1 MG/ML IJ SOLN
0.2500 mg | INTRAMUSCULAR | Status: DC | PRN
  Administered 2016-03-01: 0.25 mg via INTRAVENOUS
  Administered 2016-03-01: 0.5 mg via INTRAVENOUS

## 2016-03-01 MED ORDER — DOCUSATE SODIUM 100 MG PO CAPS: 100.0000 mg | ORAL_CAPSULE | Freq: Two times a day (BID) | ORAL | 0 refills | Status: DC

## 2016-03-01 MED ORDER — OXYCODONE HCL 5 MG PO TABS
5.0000 mg | ORAL_TABLET | ORAL | Status: DC | PRN
  Administered 2016-03-01 (×2): 10 mg via ORAL
  Administered 2016-03-02: 5 mg via ORAL
  Administered 2016-03-02 – 2016-03-03 (×4): 10 mg via ORAL
  Filled 2016-03-01 (×5): qty 2
  Filled 2016-03-01: qty 1

## 2016-03-01 MED ORDER — CELECOXIB 200 MG PO CAPS
200.0000 mg | ORAL_CAPSULE | Freq: Two times a day (BID) | ORAL | Status: DC
Start: 2016-03-01 — End: 2016-03-03
  Administered 2016-03-01 – 2016-03-03 (×4): 200 mg via ORAL
  Filled 2016-03-01 (×4): qty 1

## 2016-03-01 MED ORDER — FENTANYL CITRATE (PF) 100 MCG/2ML IJ SOLN
INTRAMUSCULAR | Status: DC | PRN
  Administered 2016-03-01 (×2): 50 ug via INTRAVENOUS

## 2016-03-01 MED ORDER — BUPIVACAINE HCL (PF) 0.5 % IJ SOLN
INTRAMUSCULAR | Status: DC | PRN
  Administered 2016-03-01: 20 mL

## 2016-03-01 MED ORDER — GABAPENTIN 300 MG PO CAPS
300.0000 mg | ORAL_CAPSULE | Freq: Two times a day (BID) | ORAL | Status: DC
  Administered 2016-03-01 – 2016-03-03 (×4): 300 mg via ORAL
  Filled 2016-03-01 (×4): qty 1

## 2016-03-01 MED ORDER — BUPIVACAINE IN DEXTROSE 0.75-8.25 % IT SOLN
INTRATHECAL | Status: DC | PRN
  Administered 2016-03-01: 2 mL via INTRATHECAL

## 2016-03-01 MED ORDER — HYDROMORPHONE HCL 2 MG/ML IJ SOLN
0.5000 mg | INTRAMUSCULAR | Status: DC | PRN
  Administered 2016-03-01: 1 mg via INTRAVENOUS
  Filled 2016-03-01: qty 1

## 2016-03-01 MED ORDER — ALUM & MAG HYDROXIDE-SIMETH 200-200-20 MG/5ML PO SUSP: 30.0000 mL | ORAL | Status: DC | PRN

## 2016-03-01 MED ORDER — ONDANSETRON HCL 4 MG/2ML IJ SOLN
INTRAMUSCULAR | Status: AC
  Filled 2016-03-01: qty 2

## 2016-03-01 MED ORDER — BUPIVACAINE HCL (PF) 0.5 % IJ SOLN
INTRAMUSCULAR | Status: AC
  Filled 2016-03-01: qty 10

## 2016-03-01 MED ORDER — 0.9 % SODIUM CHLORIDE (POUR BTL) OPTIME
TOPICAL | Status: DC | PRN
  Administered 2016-03-01: 1000 mL

## 2016-03-01 MED ORDER — MAGNESIUM CITRATE PO SOLN: 1.0000 | Freq: Once | ORAL | Status: DC | PRN

## 2016-03-01 MED ORDER — ACETAMINOPHEN 650 MG RE SUPP: 650.0000 mg | Freq: Four times a day (QID) | RECTAL | Status: DC | PRN

## 2016-03-01 MED ORDER — MEPERIDINE HCL 25 MG/ML IJ SOLN: 6.2500 mg | INTRAMUSCULAR | Status: DC | PRN

## 2016-03-01 MED ORDER — BUPIVACAINE HCL (PF) 0.5 % IJ SOLN
INTRAMUSCULAR | Status: AC
  Filled 2016-03-01: qty 30

## 2016-03-01 MED ORDER — LACTATED RINGERS IV SOLN
INTRAVENOUS | Status: DC | PRN
  Administered 2016-03-01 (×2): via INTRAVENOUS

## 2016-03-01 MED ORDER — TIZANIDINE HCL 2 MG PO TABS: 2.0000 mg | ORAL_TABLET | Freq: Three times a day (TID) | ORAL | 0 refills | Status: DC | PRN

## 2016-03-01 MED ORDER — DIPHENHYDRAMINE HCL 12.5 MG/5ML PO ELIX: 12.5000 mg | ORAL_SOLUTION | ORAL | Status: DC | PRN

## 2016-03-01 MED ORDER — AMLODIPINE BESYLATE 5 MG PO TABS
5.0000 mg | ORAL_TABLET | Freq: Every day | ORAL | Status: DC
  Administered 2016-03-01 – 2016-03-03 (×3): 5 mg via ORAL
  Filled 2016-03-01 (×3): qty 1

## 2016-03-01 MED ORDER — LACTATED RINGERS IV SOLN
INTRAVENOUS | Status: DC
  Administered 2016-03-01: 10:00:00 via INTRAVENOUS

## 2016-03-01 MED ORDER — DOCUSATE SODIUM 100 MG PO CAPS
100.0000 mg | ORAL_CAPSULE | Freq: Two times a day (BID) | ORAL | Status: DC
  Administered 2016-03-01 – 2016-03-03 (×4): 100 mg via ORAL
  Filled 2016-03-01 (×4): qty 1

## 2016-03-01 MED ORDER — CHLORHEXIDINE GLUCONATE 4 % EX LIQD: 60.0000 mL | Freq: Once | CUTANEOUS | Status: DC

## 2016-03-01 SURGICAL SUPPLY — 64 items
BANDAGE ESMARK 6X9 LF (GAUZE/BANDAGES/DRESSINGS) ×1 IMPLANT
BENZOIN TINCTURE PRP APPL 2/3 (GAUZE/BANDAGES/DRESSINGS) ×3 IMPLANT
BLADE SAGITTAL 25.0X1.19X90 (BLADE) ×2 IMPLANT
BLADE SAGITTAL 25.0X1.19X90MM (BLADE) ×1
BLADE SAW SAG 90X13X1.27 (BLADE) ×3 IMPLANT
BNDG CMPR 9X6 STRL LF SNTH (GAUZE/BANDAGES/DRESSINGS) ×1
BNDG ESMARK 6X9 LF (GAUZE/BANDAGES/DRESSINGS) ×3
BOWL SMART MIX CTS (DISPOSABLE) ×3 IMPLANT
CAPT KNEE TOTAL 3 ATTUNE ×3 IMPLANT
CEMENT HV SMART SET (Cement) ×6 IMPLANT
CLOSURE WOUND 1/2 X4 (GAUZE/BANDAGES/DRESSINGS) ×1
COVER SURGICAL LIGHT HANDLE (MISCELLANEOUS) ×3 IMPLANT
CUFF TOURNIQUET SINGLE 34IN LL (TOURNIQUET CUFF) ×3 IMPLANT
CUFF TOURNIQUET SINGLE 44IN (TOURNIQUET CUFF) IMPLANT
DRAPE EXTREMITY T 121X128X90 (DRAPE) ×3 IMPLANT
DRAPE U-SHAPE 47X51 STRL (DRAPES) ×3 IMPLANT
DRESSING AQUACEL AG SP 3.5X10 (GAUZE/BANDAGES/DRESSINGS) ×1 IMPLANT
DRSG AQUACEL AG ADV 3.5X10 (GAUZE/BANDAGES/DRESSINGS) ×3 IMPLANT
DRSG AQUACEL AG SP 3.5X10 (GAUZE/BANDAGES/DRESSINGS) ×3
DRSG PAD ABDOMINAL 8X10 ST (GAUZE/BANDAGES/DRESSINGS) ×3 IMPLANT
DURAPREP 26ML APPLICATOR (WOUND CARE) ×3 IMPLANT
ELECT REM PT RETURN 9FT ADLT (ELECTROSURGICAL) ×3
ELECTRODE REM PT RTRN 9FT ADLT (ELECTROSURGICAL) ×1 IMPLANT
EVACUATOR 1/8 PVC DRAIN (DRAIN) IMPLANT
FACESHIELD WRAPAROUND (MASK) IMPLANT
GAUZE SPONGE 4X4 12PLY STRL (GAUZE/BANDAGES/DRESSINGS) ×3 IMPLANT
GLOVE BIO SURGEON STRL SZ7 (GLOVE) ×3 IMPLANT
GLOVE BIOGEL PI IND STRL 7.0 (GLOVE) ×1 IMPLANT
GLOVE BIOGEL PI IND STRL 8 (GLOVE) ×2 IMPLANT
GLOVE BIOGEL PI INDICATOR 7.0 (GLOVE) ×2
GLOVE BIOGEL PI INDICATOR 8 (GLOVE) ×4
GLOVE ECLIPSE 7.5 STRL STRAW (GLOVE) ×6 IMPLANT
GOWN STRL REUS W/ TWL LRG LVL3 (GOWN DISPOSABLE) ×2 IMPLANT
GOWN STRL REUS W/ TWL XL LVL3 (GOWN DISPOSABLE) ×2 IMPLANT
GOWN STRL REUS W/TWL LRG LVL3 (GOWN DISPOSABLE) ×4
GOWN STRL REUS W/TWL XL LVL3 (GOWN DISPOSABLE) ×4
HANDPIECE INTERPULSE COAX TIP (DISPOSABLE) ×2
HOOD PEEL AWAY FACE SHEILD DIS (HOOD) ×6 IMPLANT
IMMOBILIZER KNEE 22 UNIV (SOFTGOODS) ×3 IMPLANT
KIT BASIN OR (CUSTOM PROCEDURE TRAY) ×3 IMPLANT
KIT ROOM TURNOVER OR (KITS) ×3 IMPLANT
MANIFOLD NEPTUNE II (INSTRUMENTS) ×3 IMPLANT
NEEDLE 22X1 1/2 (OR ONLY) (NEEDLE) ×3 IMPLANT
NS IRRIG 1000ML POUR BTL (IV SOLUTION) ×3 IMPLANT
PACK TOTAL JOINT (CUSTOM PROCEDURE TRAY) ×3 IMPLANT
PAD ARMBOARD 7.5X6 YLW CONV (MISCELLANEOUS) ×6 IMPLANT
PADDING CAST ABS 6INX4YD NS (CAST SUPPLIES) ×2
PADDING CAST ABS COTTON 6X4 NS (CAST SUPPLIES) ×1 IMPLANT
SET HNDPC FAN SPRY TIP SCT (DISPOSABLE) ×1 IMPLANT
STAPLER VISISTAT 35W (STAPLE) IMPLANT
STRIP CLOSURE SKIN 1/2X4 (GAUZE/BANDAGES/DRESSINGS) ×2 IMPLANT
SUCTION FRAZIER HANDLE 10FR (MISCELLANEOUS)
SUCTION TUBE FRAZIER 10FR DISP (MISCELLANEOUS) IMPLANT
SUT MNCRL AB 3-0 PS2 18 (SUTURE) IMPLANT
SUT VIC AB 0 CTB1 27 (SUTURE) ×6 IMPLANT
SUT VIC AB 1 CT1 27 (SUTURE) ×4
SUT VIC AB 1 CT1 27XBRD ANBCTR (SUTURE) ×2 IMPLANT
SUT VIC AB 2-0 CTB1 (SUTURE) ×6 IMPLANT
SYR 50ML LL SCALE MARK (SYRINGE) ×3 IMPLANT
TOWEL OR 17X24 6PK STRL BLUE (TOWEL DISPOSABLE) ×3 IMPLANT
TOWEL OR 17X26 10 PK STRL BLUE (TOWEL DISPOSABLE) ×3 IMPLANT
TRAY CATH 16FR W/PLASTIC CATH (SET/KITS/TRAYS/PACK) IMPLANT
TRAY FOLEY CATH 16FRSI W/METER (SET/KITS/TRAYS/PACK) IMPLANT
WRAP KNEE MAXI GEL POST OP (GAUZE/BANDAGES/DRESSINGS) ×3 IMPLANT

## 2016-03-01 NOTE — Anesthesia Preprocedure Evaluation (Addendum)
Anesthesia Evaluation  Patient identified by MRN, date of birth, ID band Patient awake    Reviewed: Allergy & Precautions, NPO status , Patient's Chart, lab work & pertinent test results  Airway Mallampati: II  TM Distance: >3 FB Neck ROM: Full    Dental  (+) Dental Advisory Given, Teeth Intact   Pulmonary neg pulmonary ROS,    breath sounds clear to auscultation       Cardiovascular hypertension, Pt. on medications  Rhythm:Regular Rate:Normal     Neuro/Psych PSYCHIATRIC DISORDERS Anxiety negative neurological ROS     GI/Hepatic negative GI ROS, Neg liver ROS,   Endo/Other  Morbid obesity  Renal/GU negative Renal ROS  negative genitourinary   Musculoskeletal  (+) Arthritis , Osteoarthritis,    Abdominal   Peds negative pediatric ROS (+)  Hematology negative hematology ROS (+)   Anesthesia Other Findings   Reproductive/Obstetrics negative OB ROS                           Lab Results  Component Value Date   WBC 7.6 02/22/2016   HGB 15.5 02/22/2016   HCT 45.1 02/22/2016   MCV 92.4 02/22/2016   PLT 231 02/22/2016   Lab Results  Component Value Date   CREATININE 0.99 02/22/2016   BUN 12 02/22/2016   NA 139 02/22/2016   K 4.0 02/22/2016   CL 106 02/22/2016   CO2 25 02/22/2016   Lab Results  Component Value Date   INR 1.06 02/22/2016   EKG: SB.   Anesthesia Physical Anesthesia Plan  ASA: II  Anesthesia Plan: Spinal   Post-op Pain Management:  Regional for Post-op pain   Induction: Intravenous  Airway Management Planned: Natural Airway and Simple Face Mask  Additional Equipment:   Intra-op Plan:   Post-operative Plan:   Informed Consent: I have reviewed the patients History and Physical, chart, labs and discussed the procedure including the risks, benefits and alternatives for the proposed anesthesia with the patient or authorized representative who has indicated  his/her understanding and acceptance.   Dental advisory given  Plan Discussed with: CRNA, Anesthesiologist and Surgeon  Anesthesia Plan Comments:        Anesthesia Quick Evaluation

## 2016-03-01 NOTE — Progress Notes (Signed)
Orthopedic Tech Progress Note Patient Details:  Danny Cobb [facility] 03-24-1945 MR:[id number]  CPM Left Knee CPM Left Knee: On Left Knee Flexion (Degrees): 70 Left Knee Extension (Degrees): 0 Additional Comments: Applied CPM to Left Leg/knee at 0-70. Pt tolerated well.  Nurse and family at bedside.    Kristopher Oppenheim 03/01/2016, 1:53 PM

## 2016-03-01 NOTE — Anesthesia Procedure Notes (Signed)
Procedure Name: MAC Date/Time: 03/01/2016 10:40 AM Performed by: Garrison Columbus T Pre-anesthesia Checklist: Patient identified, Emergency Drugs available, Suction available and Patient being monitored Patient Re-evaluated:Patient Re-evaluated prior to inductionOxygen Delivery Method: Simple face mask Preoxygenation: Pre-oxygenation with 100% oxygen Intubation Type: IV induction Placement Confirmation: positive ETCO2 and breath sounds checked- equal and bilateral Dental Injury: Teeth and Oropharynx as per pre-operative assessment

## 2016-03-01 NOTE — H&P (Addendum)
TOTAL KNEE ADMISSION H&P  Patient is being admitted for left total knee arthroplasty.  Subjective:  Chief Complaint:left knee pain.  HPI: Danny Cobb, 71 y.o. male, has a history of pain and functional disability in the left knee due to arthritis and has failed non-surgical conservative treatments for greater than 12 weeks to includeNSAID's and/or analgesics, corticosteriod injections, viscosupplementation injections, flexibility and strengthening excercises, use of assistive devices, weight reduction as appropriate and activity modification.  Onset of symptoms was gradual, starting 8 years ago with gradually worsening course since that time. The patient noted no past surgery on the left knee(s).  Patient currently rates pain in the left knee(s) at 8 out of 10 with activity. Patient has night pain, worsening of pain with activity and weight bearing, pain that interferes with activities of daily living, pain with passive range of motion, crepitus and joint swelling.  Patient has evidence of subchondral cysts, subchondral sclerosis, periarticular osteophytes, joint subluxation and joint space narrowing by imaging studies. This patient has had failure of all reasonable conservative care. There is no active infection.the patient is going to skilled nursing and will need 2 midnight stay for inpatient rehab prior to discharge.  Patient Active Problem List   Diagnosis Date Noted  . Sebaceous cyst - midback - 3 cm 12/13/2010  . Lipoma of scalp - 4 cm 12/13/2010   Past Medical History:  Diagnosis Date  . Anxiety   . Arthritis   . Cataract    right eye  . Colon polyps   . Elevated PSA   . History of kidney stones    over 10 yrs ago  . Hypertension 12-20-10   tx. Lotrel  . Hypertrophy of prostate without urinary obstruction and other lower urinary tract symptoms (LUTS)   . Lipoma of neck October 2012   posterior neck lesion   . Other malaise and fatigue   . Tubular adenoma 09/2010    Past  Surgical History:  Procedure Laterality Date  . KNEE ARTHROCENTESIS  1989   left knee -scope  . MASS EXCISION  12/24/2010   Procedure: EXCISION MASS;  Surgeon: Imogene Burn. Georgette Dover, MD;  Location: [facility];  Service: General;  Laterality: N/A;  Excision of Sebaceous Masses  . PROSTATE SURGERY  2010   prostate biopsy-last done   . TONSILLECTOMY      No prescriptions prior to admission.   No Known Allergies  Social History  Substance Use Topics  . Smoking status: Never Smoker  . Smokeless tobacco: Never Used  . Alcohol use Yes     Comment: 1 glass per week    Family History  Problem Relation Age of Onset  . Cancer Mother      ROS  ROS: I have reviewed the patient's review of systems thoroughly and there are no positive responses as relates to the HPI. Objective:  Physical Exam  Vital signs in last 24 hours:    Vitals:   03/01/16 0915  BP: (!) 150/89  Pulse: 66  Resp: 20  Temp: 98 F (36.7 C)   Well-developed well-nourished patient in no acute distress. Alert and oriented x3 HEENT:within normal limits Cardiac: Regular rate and rhythm Pulmonary: Lungs clear to auscultation Abdomen: Soft and nontender.  Normal active bowel sounds  Musculoskeletal: (l knee painful rom limited rom no instability Labs: Recent Results (from the past 2160 hour(s))  Surgical pcr screen     Status: Abnormal   Collection Time: 02/22/16 10:07 AM  Result Value Ref Range  MRSA, PCR NEGATIVE NEGATIVE   Staphylococcus aureus POSITIVE (A) NEGATIVE    Comment:        The Xpert SA Assay (FDA approved for NASAL specimens in patients over 25 years of age), is one component of a comprehensive surveillance program.  Test performance has been validated by [facility] for patients greater than or equal to 27 year old. It is not intended to diagnose infection nor to guide or monitor treatment.   Urinalysis, Routine w reflex microscopic     Status: None   Collection Time: 02/22/16 10:07 AM   Result Value Ref Range   Color, Urine YELLOW YELLOW   APPearance CLEAR CLEAR   Specific Gravity, Urine 1.021 1.005 - 1.030   pH 5.0 5.0 - 8.0   Glucose, UA NEGATIVE NEGATIVE mg/dL   Hgb urine dipstick NEGATIVE NEGATIVE   Bilirubin Urine NEGATIVE NEGATIVE   Ketones, ur NEGATIVE NEGATIVE mg/dL   Protein, ur NEGATIVE NEGATIVE mg/dL   Nitrite NEGATIVE NEGATIVE   Leukocytes, UA NEGATIVE NEGATIVE  CBC WITH DIFFERENTIAL     Status: None   Collection Time: 02/22/16 10:08 AM  Result Value Ref Range   WBC 7.6 4.0 - 10.5 K/uL   RBC 4.88 4.22 - 5.81 MIL/uL   Hemoglobin 15.5 13.0 - 17.0 g/dL   HCT 45.1 39.0 - 52.0 %   MCV 92.4 78.0 - 100.0 fL   MCH 31.8 26.0 - 34.0 pg   MCHC 34.4 30.0 - 36.0 g/dL   RDW 13.8 11.5 - 15.5 %   Platelets 231 150 - 400 K/uL   Neutrophils Relative % 58 %   Neutro Abs 4.5 1.7 - 7.7 K/uL   Lymphocytes Relative 30 %   Lymphs Abs 2.3 0.7 - 4.0 K/uL   Monocytes Relative 8 %   Monocytes Absolute 0.6 0.1 - 1.0 K/uL   Eosinophils Relative 3 %   Eosinophils Absolute 0.2 0.0 - 0.7 K/uL   Basophils Relative 1 %   Basophils Absolute 0.0 0.0 - 0.1 K/uL  Comprehensive metabolic panel     Status: Abnormal   Collection Time: 02/22/16 10:08 AM  Result Value Ref Range   Sodium 139 135 - 145 mmol/L   Potassium 4.0 3.5 - 5.1 mmol/L   Chloride 106 101 - 111 mmol/L   CO2 25 22 - 32 mmol/L   Glucose, Bld 112 (H) 65 - 99 mg/dL   BUN 12 6 - 20 mg/dL   Creatinine, Ser 0.99 0.61 - 1.24 mg/dL   Calcium 9.6 8.9 - 10.3 mg/dL   Total Protein 7.1 6.5 - 8.1 g/dL   Albumin 4.1 3.5 - 5.0 g/dL   AST 22 15 - 41 U/L   ALT 25 17 - 63 U/L   Alkaline Phosphatase 67 38 - 126 U/L   Total Bilirubin 0.7 0.3 - 1.2 mg/dL   GFR calc non Af Amer >60 >60 mL/min   GFR calc Af Amer >60 >60 mL/min    Comment: (NOTE) The eGFR has been calculated using the CKD EPI equation. This calculation has not been validated in all clinical situations. eGFR's persistently <60 mL/min signify possible Chronic  Kidney Disease.    Anion gap 8 5 - 15  Protime-INR     Status: None   Collection Time: 02/22/16 10:08 AM  Result Value Ref Range   Prothrombin Time 13.8 11.4 - 15.2 seconds   INR 1.06   Type and screen Order type and screen if day of surgery is less than 15 days  from draw of preadmission visit or order morning of surgery if day of surgery is greater than 6 days from preadmission visit.     Status: None   Collection Time: 02/22/16 10:22 AM  Result Value Ref Range   ABO/RH(D) A POS    Antibody Screen NEG    Sample Expiration 03/07/2016    Extend sample reason NO TRANSFUSIONS OR PREGNANCY IN THE PAST 3 MONTHS   ABO/Rh     Status: None   Collection Time: 02/22/16 10:22 AM  Result Value Ref Range   ABO/RH(D) A POS     Estimated body mass index is 35.47 kg/m as calculated from the following:   Height as of 02/22/16: 5' 7"  (1.702 m).   Weight as of 02/22/16: 102.7 kg (226 lb 8 oz).   Imaging Review Plain radiographs demonstrate severe degenerative joint disease of the left knee(s). The overall alignment ismild varus. The bone quality appears to be good for age and reported activity level.  Assessment/Plan:  End stage arthritis, left knee   The patient history, physical examination, clinical judgment of the provider and imaging studies are consistent with end stage degenerative joint disease of the left knee(s) and total knee arthroplasty is deemed medically necessary. The treatment options including medical management, injection therapy arthroscopy and arthroplasty were discussed at length. The risks and benefits of total knee arthroplasty were presented and reviewed. The risks due to aseptic loosening, infection, stiffness, patella tracking problems, thromboembolic complications and other imponderables were discussed. The patient acknowledged the explanation, agreed to proceed with the plan and consent was signed. Patient is being admitted for inpatient treatment for surgery, pain control,  PT, OT, prophylactic antibiotics, VTE prophylaxis, progressive ambulation and ADL's and discharge planning. The patient is planning to be discharged to skilled nursing facility

## 2016-03-01 NOTE — Anesthesia Procedure Notes (Signed)
Anesthesia Regional Block:  Adductor canal block  Pre-Anesthetic Checklist: ,, timeout performed, Correct Patient, Correct Site, Correct Laterality, Correct Procedure, Correct Position, site marked, Risks and benefits discussed,  Surgical consent,  Pre-op evaluation,  At surgeon's request and post-op pain management  Laterality: Left  Prep: chloraprep       Needles:  Injection technique: Single-shot  Needle Type: Echogenic Needle     Needle Length: 9cm 9 cm Needle Gauge: 21 and 21 G    Additional Needles:  Procedures: ultrasound guided (picture in chart) Adductor canal block Narrative:  Start time: 03/01/2016 10:10 AM End time: 03/01/2016 10:20 AM Injection made incrementally with aspirations every 5 mL.  Performed by: Personally  Anesthesiologist: Suella Broad D  Additional Notes: Tolerated well.

## 2016-03-01 NOTE — Anesthesia Postprocedure Evaluation (Addendum)
Anesthesia Post Note  Patient: Danny Cobb  Procedure(s) Performed: Procedure(s) (LRB): TOTAL KNEE ARTHROPLASTY (Left)  Patient location during evaluation: PACU Anesthesia Type: Spinal Level of consciousness: oriented and awake and alert Pain management: pain level controlled Vital Signs Assessment: post-procedure vital signs reviewed and stable Respiratory status: spontaneous breathing, respiratory function stable and patient connected to nasal cannula oxygen Cardiovascular status: blood pressure returned to baseline and stable Postop Assessment: no headache, no backache and spinal receding Anesthetic complications: no       Last Vitals:  Vitals:   03/01/16 1330 03/01/16 1345  BP: (!) 143/82 140/83  Pulse: (!) 52 (!) 52  Resp:    Temp:      Last Pain: There were no vitals filed for this visit.               Effie Berkshire

## 2016-03-01 NOTE — Anesthesia Procedure Notes (Signed)
Spinal  Patient location during procedure: OR Start time: 03/01/2016 10:35 AM End time: 03/01/2016 10:38 AM Staffing Anesthesiologist: Suella Broad D Performed: anesthesiologist  Preanesthetic Checklist Completed: patient identified, site marked, surgical consent, pre-op evaluation, timeout performed, IV checked, risks and benefits discussed and monitors and equipment checked Spinal Block Patient position: sitting Prep: Betadine Patient monitoring: heart rate, continuous pulse ox, blood pressure and cardiac monitor Approach: midline Location: L4-5 Injection technique: single-shot Needle Needle type: Introducer and Pencan  Needle gauge: 24 G Needle length: 9 cm Additional Notes Negative paresthesia. Negative blood return. Positive free-flowing CSF. Expiration date of kit checked and confirmed. Patient tolerated procedure well, without complications.

## 2016-03-01 NOTE — Transfer of Care (Signed)
Immediate Anesthesia Transfer of Care Note  Patient: Danny Cobb  Procedure(s) Performed: Procedure(s): TOTAL KNEE ARTHROPLASTY (Left)  Patient Location: PACU  Anesthesia Type:MAC, Regional and Spinal  Level of Consciousness: awake and alert   Airway & Oxygen Therapy: Patient Spontanous Breathing and Patient connected to face mask oxygen  Post-op Assessment: Report given to RN and Post -op Vital signs reviewed and stable  Post vital signs: Reviewed and stable  Last Vitals:  Vitals:   03/01/16 0915 03/01/16 1025  BP: (!) 150/89 (!) 147/86  Pulse: 66 (!) 57  Resp: 20 (!) 9  Temp: 36.7 C     Last Pain: There were no vitals filed for this visit.    Patients Stated Pain Goal: 6 (25/48/62 8241)  Complications: No apparent anesthesia complications

## 2016-03-01 NOTE — Discharge Instructions (Signed)

## 2016-03-02 LAB — CBC
HCT: 41.7 % (ref 39.0–52.0)
Hemoglobin: 13.8 g/dL (ref 13.0–17.0)
MCH: 30.7 pg (ref 26.0–34.0)
MCHC: 33.1 g/dL (ref 30.0–36.0)
MCV: 92.7 fL (ref 78.0–100.0)
PLATELETS: 217 10*3/uL (ref 150–400)
RBC: 4.5 MIL/uL (ref 4.22–5.81)
RDW: 13.7 % (ref 11.5–15.5)
WBC: 11.7 10*3/uL — AB (ref 4.0–10.5)

## 2016-03-02 LAB — BASIC METABOLIC PANEL
ANION GAP: 7 (ref 5–15)
BUN: 13 mg/dL (ref 6–20)
CO2: 25 mmol/L (ref 22–32)
Calcium: 8.9 mg/dL (ref 8.9–10.3)
Chloride: 107 mmol/L (ref 101–111)
Creatinine, Ser: 1.56 mg/dL — ABNORMAL HIGH (ref 0.61–1.24)
GFR, EST AFRICAN AMERICAN: 49 mL/min — AB (ref >60)
GFR, EST NON AFRICAN AMERICAN: 43 mL/min — AB (ref >60)
Glucose, Bld: 144 mg/dL — ABNORMAL HIGH (ref 65–99)
POTASSIUM: 4.4 mmol/L (ref 3.5–5.1)
SODIUM: 139 mmol/L (ref 135–145)

## 2016-03-02 MED ORDER — TAMSULOSIN HCL 0.4 MG PO CAPS
0.4000 mg | ORAL_CAPSULE | Freq: Every day | ORAL | Status: DC
  Administered 2016-03-02 – 2016-03-03 (×2): 0.4 mg via ORAL
  Filled 2016-03-02 (×2): qty 1

## 2016-03-02 NOTE — Evaluation (Signed)
Physical Therapy Evaluation Patient Details Name: Danny Cobb MRN: [id number] DOB: [birth year] Today's Date: 03/02/2016   History of Present Illness  Pt is a 71 y.o. male s/p L TKA. Pt has a PMH significant for anxiety, arthritis, cataract in R eye, colon polyps, elevated PSA, hypertension, tubular adenoma, and lipoma of neck.  Clinical Impression  Pt is POD 1 following the above procedure and moving well with therapy. Prior to admission, pt was completely independent, living alone, and working a few days a week in [employer] [place]. Pt presents with Min A -Min Guard assist for a majority of mobility this session and was able to ambulate 100'. Pt will have family support at home and would benefit from returning home following discharge with HHPT as recommended below. Pt will benefit from continuing to be seen acutely in order to address below deficits before discharge.     Follow Up Recommendations Home health PT;Supervision - Intermittent    Equipment Recommendations  Rolling walker with 5" wheels;3in1 (PT)    Recommendations for Other Services OT consult     Precautions / Restrictions Precautions Precautions: Knee;Fall Precaution Booklet Issued: Yes (comment) Precaution Comments: Handout given and reviewed no pillow under knee Required Braces or Orthoses: Knee Immobilizer - Left Knee Immobilizer - Left: On at all times;Other (comment) (no specifics noted) Restrictions Weight Bearing Restrictions: Yes LLE Weight Bearing: Weight bearing as tolerated      Mobility  Bed Mobility Overal bed mobility: Needs Assistance Bed Mobility: Supine to Sit     Supine to sit: Min assist     General bed mobility comments: Min A to bring LLE EOB  Transfers Overall transfer level: Needs assistance Equipment used: Rolling walker (2 wheeled) Transfers: Sit to/from Stand Sit to Stand: Min guard         General transfer comment: Min guard for EOB  Ambulation/Gait Ambulation/Gait  assistance: Min guard Ambulation Distance (Feet): 100 Feet Assistive device: Rolling walker (2 wheeled) Gait Pattern/deviations: Step-through pattern;Decreased step length - right;Decreased stance time - left;Antalgic Gait velocity: decreased Gait velocity interpretation: Below normal speed for age/gender General Gait Details: Mild antalgic gait, min cues for sequencing  Stairs            Wheelchair Mobility    Modified Rankin (Stroke Patients Only)       Balance Overall balance assessment: Needs assistance Sitting-balance support: No upper extremity supported;Feet supported Sitting balance-Leahy Scale: Good Sitting balance - Comments: sitting EOB no back support   Standing balance support: No upper extremity supported Standing balance-Leahy Scale: Fair Standing balance comment: able to stand briefly at [place] without assistance                             Pertinent Vitals/Pain Pain Assessment: 0-10 Pain Score: 3  Faces Pain Scale: Hurts little more Pain Location: left knee Pain Descriptors / Indicators: Dull Pain Intervention(s): Monitored during session;Ice applied;Repositioned    Home Living Family/patient expects to be discharged to:: Private residence Living Arrangements: Alone Available Help at Discharge: Friend(s);Available PRN/intermittently;Home health;Available 24 hours/day Type of Home: House Home Access: Stairs to enter Entrance Stairs-Rails: None Entrance Stairs-Number of Steps: 2 Home Layout: Two level;Able to live on main level with bedroom/bathroom Home Equipment: Hand held shower head;Shower seat - built in      Prior Function Level of Independence: Independent         Comments: pt was driving, living alone, 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      Hand Dominance   Dominant Hand: Right    Extremity/Trunk Assessment   Upper Extremity Assessment Upper  Extremity Assessment: Defer to OT evaluation    Lower Extremity Assessment Lower Extremity Assessment: LLE deficits/detail LLE Deficits / Details: pt with normal post op pain and weakness. At least 3/5 ankle and 2/5 knee and hip per gross functional assessment       Communication   Communication: No difficulties  Cognition Arousal/Alertness: Awake/alert Behavior During Therapy: WFL for tasks assessed/performed Overall Cognitive Status: Within Functional Limits for tasks assessed                      General Comments      Exercises Total Joint Exercises Ankle Circles/Pumps: AROM;Both;20 reps;Supine Quad Sets: AROM;Left;5 reps;Supine Towel Squeeze: AROM;Left;10 reps;Supine Heel Slides: AAROM;Left;10 reps;Supine Goniometric ROM: 5-84   Assessment/Plan    PT Assessment Patient needs continued PT services  PT Problem List Decreased strength;Decreased activity tolerance;Decreased range of motion;Decreased balance;Decreased mobility;Decreased knowledge of use of DME;Pain          PT Treatment Interventions DME instruction;Gait training;Stair training;Functional mobility training;Therapeutic activities;Therapeutic exercise;Balance training;Patient/family education    PT Goals (Current goals can be found in the Care Plan section)  Acute Rehab PT Goals Patient Stated Goal: to get back to working a few days per week PT Goal Formulation: With patient Time For Goal Achievement: 03/09/16 Potential to Achieve Goals: Good    Frequency 7X/week   Barriers to discharge        Co-evaluation               End of Session Equipment Utilized During Treatment: Gait belt;Left knee immobilizer Activity Tolerance: Patient tolerated treatment well Patient left: in chair;with call bell/phone within reach Nurse Communication: Mobility status;Patient requests pain meds         Time: 0830-0923 PT Time Calculation (min) (ACUTE ONLY): 53 min   Charges:   PT  Evaluation $PT Eval Moderate Complexity: 1 Procedure PT Treatments $Gait Training: 8-22 mins $Therapeutic Exercise: 8-22 mins   PT G Codes:        Scheryl Marten PT, DPT  [phone]  03/02/2016, 5:22 PM

## 2016-03-02 NOTE — Care Management Note (Signed)
Case Management Note  Patient Details  Name: Danny Cobb MRN: [id number] Date of Birth: 05-21-1945  Subjective/Objective: 71 yr old gentleman s/p left total knee arthroplasty.                   Action/Plan: Case manager spoke with patient concerning discharge plan. He was initially scheduled for SNF, has decided its best for him to do therapy at home. Patient was preoperatively setup with [place], no changes. CM has ordered RW and 3in1. Patient states he has family support at discharge.  Expected Discharge Date:    03/03/16              Expected Discharge Plan:  [place]  In-House Referral:  NA  Discharge planning Services  CM Consult  Post Acute Care Choice:  Durable Medical Equipment, Home Health Choice offered to:  Patient  DME Arranged:  Walker rolling DME Agency:  [place]:  PT [facility] Agency:  [place]  Status of Service:  Completed, signed off  If discussed at [place] of Stay Meetings, dates discussed:    Additional Comments:  Ninfa Meeker, RN 03/02/2016, 10:22 AM

## 2016-03-02 NOTE — Progress Notes (Signed)
Physical Therapy Treatment Note:  Pt presents with good tolerance for gait and mobility training. Pt requires assistance to achieve a SLR and Hip ABD/ADD this session. Improved gait distance noted and weight bearing through LLE with gait. Pt becomes more confident as distance increases. Pt will need to perform stair negotiation prior to discharge home.     03/02/16 1725  PT Visit Information  Last PT Received On 03/02/16  Assistance Needed +1  History of Present Illness Pt is a 71 y.o. male s/p L TKA. Pt has a PMH significant for anxiety, arthritis, cataract in R eye, colon polyps, elevated PSA, hypertension, tubular adenoma, and lipoma of neck.  Subjective Data  Subjective pt states that he is doing well. Has minimal pain  Patient Stated Goal to get back to working a few days per week  Precautions  Precautions Knee;Fall  Precaution Booklet Issued Yes (comment)  Precaution Comments Handout given and reviewed no pillow under knee  Required Braces or Orthoses Knee Immobilizer - Left  Knee Immobilizer - Left On at all times;Other (comment) (no specifics noted)  Restrictions  Weight Bearing Restrictions Yes  LLE Weight Bearing WBAT  Pain Assessment  Pain Assessment 0-10  Pain Score 3  Pain Location left knee  Pain Descriptors / Indicators Dull  Pain Intervention(s) Monitored during session;Repositioned;Ice applied  Cognition  Arousal/Alertness Awake/alert  Behavior During Therapy WFL for tasks assessed/performed  Overall Cognitive Status Within Functional Limits for tasks assessed  Bed Mobility  Overal bed mobility Needs Assistance  Bed Mobility Supine to Sit  Supine to sit Min guard  General bed mobility comments Min guard with trace cues for proper mobility  Transfers  Overall transfer level Needs assistance  Equipment used Rolling walker (2 wheeled)  Transfers Sit to/from Stand  Sit to Stand Supervision  General transfer comment Supervision for safety  Ambulation/Gait   Ambulation/Gait assistance Min guard  Ambulation Distance (Feet) 150 Feet  Assistive device Rolling walker (2 wheeled)  Gait Pattern/deviations Step-through pattern;Decreased step length - right;Decreased stance time - left;Antalgic  General Gait Details Mild antalgic gait, min cues for sequencing  Gait velocity decreased  Gait velocity interpretation Below normal speed for age/gender  Exercises  Exercises Total Joint  Total Joint Exercises  Hip ABduction/ADduction AAROM;Left;10 reps;Supine  Straight Leg Raises AAROM;Left;10 reps;Supine  PT - End of Session  Equipment Utilized During Treatment Gait belt;Left knee immobilizer  Activity Tolerance Patient tolerated treatment well  Patient left in chair;with call bell/phone within reach  Nurse Communication Mobility status  PT - Assessment/Plan  PT Plan Current plan remains appropriate  PT Frequency (ACUTE ONLY) 7X/week  Follow Up Recommendations Home health PT;Supervision - Intermittent  PT equipment Rolling walker with 5" wheels;3in1 (PT)  PT Goal Progression  Progress towards PT goals Progressing toward goals  PT Time Calculation  PT Start Time (ACUTE ONLY) 1612  PT Stop Time (ACUTE ONLY) 1638  PT Time Calculation (min) (ACUTE ONLY) 26 min  PT General Charges  $$ ACUTE PT VISIT 1 Procedure  PT Treatments  $Gait Training 8-22 mins  $Therapeutic Exercise 8-22 mins   Scheryl Marten PT, DPT  [phone]

## 2016-03-02 NOTE — Progress Notes (Signed)
  PATIENT ID: Danny Cobb  MRN: [id number]  DOB/AGE:  November 13, 1945 / 71 y.o.  1 Day Post-Op Procedure(s) (LRB): TOTAL KNEE ARTHROPLASTY (Left)  Subjective: Pain is moderate.  No c/o chest pain or SOB.  No flatus, +tol PO liquids.  Having urinary urge, but not much output. No N/V  Objective: Vital signs in last 24 hours: Temp:  [97.5 F (36.4 C)-99 F (37.2 C)] 98.5 F (36.9 C) (01/20 0923) Pulse Rate:  [52-97] 77 (01/20 0923) Resp:  [9-16] 16 (01/20 0600) BP: (105-158)/(68-96) 150/85 (01/20 0923) SpO2:  [92 %-99 %] 94 % (01/20 0923)  Intake/Output from previous day: 01/19 0701 - 01/20 0700 In: 1300 [I.V.:1300] Out: 700 [Urine:400; Blood:300] Intake/Output this shift: No intake/output data recorded.   Recent Labs  03/02/16 0424  HGB 13.8    Recent Labs  03/02/16 0424  WBC 11.7*  RBC 4.50  HCT 41.7  PLT 217    Recent Labs  03/02/16 0424  NA 139  K 4.4  CL 107  CO2 25  BUN 13  CREATININE 1.56*  GLUCOSE 144*  CALCIUM 8.9   No results for input(s): LABPT, INR in the last 72 hours.  Physical Exam: ABD soft Sensation intact distally Intact pulses distally Dorsiflexion/Plantar flexion intact Incision: dressing C/D/I Compartment soft  Assessment/Plan: 1 Day Post-Op Procedure(s) (LRB): TOTAL KNEE ARTHROPLASTY (Left)   Advance diet Weight Bearing as Tolerated (WBAT) VTE prophylaxis: ASA & SCD Saline lock IVFs, Bladder scan with I&O cath if >400 PT today. Anticipate d/c once clears PT--Home with HHPT vs SNF?  Danny Cobb, [place], [place]  [address] Office: [phone] Mobile: [phone]  03/02/2016, 10:23 AM

## 2016-03-02 NOTE — Progress Notes (Signed)
Orthopedic Tech Progress Note Patient Details:  Danny Cobb [facility] [birth year] [id number] Put on cpm at 1345 Patient ID: Danny Cobb, male   DOB: 02-Nov-1945, 71 y.o.   MRN: [id number]   Braulio Bosch 03/02/2016, 1:44 PM

## 2016-03-02 NOTE — Progress Notes (Signed)
Orthopedic Tech Progress Note Patient Details:  [name] 1946-01-30 [id number]  Patient ID: Danny Cobb, male   DOB: 01/04/1946, 71 y.o.   MRN: [id number] Applied cpm 0-60  Karolee Stamps 03/02/2016, 6:34 AM

## 2016-03-02 NOTE — Evaluation (Signed)
Occupational Therapy Evaluation Patient Details Name: Danny Cobb MRN: [id number] DOB: [birth year] Today's Date: 03/02/2016    History of Present Illness Pt is a 71 y.o. male s/p L TKA. Pt has a PMH significant for anxiety, arthritis, cataract in R eye, colon polyps, elevated PSA, hypertension, tubular adenoma, and lipoma of neck.   Clinical Impression   PTA, pt was independent with ADL and functional mobility. Pt currently requires min assist for LB ADL, min guard assist for toilet transfers, and supervision for standing grooming tasks at sink. Pt lives alone but reports that he does have family and friends who will be able to provide 24 hour supervision/assistance initially post-acute D/C. Pt would benefit from continued OT services while admitted to improve independence with ADL and functional mobility in order to maximize return to [facility] and ensure safe D/C home. Due to current ADL performance, feel pt will progress to be able to D/C home with home health OT services and 24 hour assistance/supervision. OT will continue to follow acutely with focus on LB ADL and shower transfers.    Follow Up Recommendations  Home health OT;Supervision/Assistance - 24 hour    Equipment Recommendations  3 in 1 bedside commode    Recommendations for Other Services       Precautions / Restrictions Precautions Precautions: Knee;Fall Precaution Booklet Issued: No Precaution Comments: Reviewed knee precautions during ADL. Required Braces or Orthoses: Knee Immobilizer - Left Restrictions Weight Bearing Restrictions: Yes LLE Weight Bearing: Weight bearing as tolerated      Mobility Bed Mobility               General bed mobility comments: OOB in chair on OT arrival  Transfers Overall transfer level: Needs assistance Equipment used: Rolling walker (2 wheeled) Transfers: Sit to/from Stand Sit to Stand: Supervision         General transfer comment: Supervision for safety    Balance  Overall balance assessment: Needs assistance Sitting-balance support: No upper extremity supported;Feet supported Sitting balance-Leahy Scale: Good     Standing balance support: No upper extremity supported;During functional activity;Bilateral upper extremity supported Standing balance-Leahy Scale: Fair Standing balance comment: Able to stand at sink for grooming tasks without UE support.                            ADL Overall ADL's : Needs assistance/impaired Eating/Feeding: Set up;Sitting   Grooming: Supervision/safety;Standing   Upper Body Bathing: Set up;Sitting   Lower Body Bathing: Minimal assistance;Sit to/from stand   Upper Body Dressing : Set up;Sitting   Lower Body Dressing: Minimal assistance;Sit to/from stand   Toilet Transfer: Min guard;Ambulation;RW;BSC   Toileting- Water quality scientist and Hygiene: Supervision/safety;Sit to/from stand       Functional mobility during ADLs: Min guard;Rolling walker General ADL Comments: Pt educated on dressing techniques, knee precautions during ADL, safe toilet transfers, fall prevention, energy conservation, and use of ice for pain management.     Vision Vision Assessment?: No apparent visual deficits   Perception     Praxis      Pertinent Vitals/Pain Pain Assessment: Faces Faces Pain Scale: Hurts little more Pain Location: left knee Pain Descriptors / Indicators: Dull Pain Intervention(s): Limited activity within patient's tolerance;Monitored during session;Repositioned;Ice applied     Hand Dominance Right   Extremity/Trunk Assessment Upper Extremity Assessment Upper Extremity Assessment: Overall WFL for tasks assessed   Lower Extremity Assessment Lower Extremity Assessment: Defer to PT evaluation  Communication Communication Communication: No difficulties   Cognition Arousal/Alertness: Awake/alert Behavior During Therapy: WFL for tasks assessed/performed Overall Cognitive Status:  Within Functional Limits for tasks assessed                     General Comments       Exercises       Shoulder Instructions      Home Living Family/patient expects to be discharged to:: Private residence Living Arrangements: Alone Available Help at Discharge: Friend(s);Available PRN/intermittently;Home health;Available 24 hours/day Type of Home: House Home Access: Stairs to enter [employer] of Steps: 2 Entrance Stairs-Rails: None Home Layout: Two level;Able to live on main level with bedroom/bathroom Alternate Level Stairs-Number of Steps: n/a   Bathroom Shower/Tub: Walk-in shower;Curtain   Bathroom Toilet: Handicapped height Bathroom Accessibility: Yes   Home Equipment: Hand held shower head;Shower seat - built in          Prior Functioning/Environment Level of Independence: Independent        Comments: pt was driving, living alone, 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         OT Problem List: Decreased strength;Decreased range of motion;Decreased activity tolerance;Impaired balance (sitting and/or standing);Decreased safety awareness;Decreased knowledge of use of DME or AE;Decreased knowledge of precautions;Pain   OT Treatment/Interventions: Self-care/ADL training;Therapeutic exercise;Energy conservation;DME and/or AE instruction;Therapeutic activities;Patient/family education;Balance training    OT Goals(Current goals can be found in the care plan section) Acute Rehab OT Goals Patient Stated Goal: to get back to working a few days per week OT Goal Formulation: With patient Time For Goal Achievement: 03/09/16 Potential to Achieve Goals: Good ADL Goals Pt Will Perform Grooming: with modified independence;standing Pt Will Perform Lower Body Bathing: with modified independence;sit to/from stand (with or without AE) Pt Will Perform Lower Body Dressing: with modified  independence;sit to/from stand (with or without AE) Pt Will Transfer to Toilet: with modified independence;ambulating;bedside commode (BSC over toilet) Pt Will Perform Toileting - Clothing Manipulation and hygiene: with modified independence;sit to/from stand Pt Will Perform Tub/Shower Transfer: Tub transfer;with modified independence;3 in 1;ambulating;rolling walker  OT Frequency: Min 2X/week   Barriers to D/C:            Co-evaluation              End of Session Equipment Utilized During Treatment: Gait belt;Rolling walker Nurse Communication: Mobility status  Activity Tolerance: Patient tolerated treatment well Patient left: in chair;with call bell/phone within reach   Time: [id number] OT Time Calculation (min): 13 min Charges:  OT General Charges $OT Visit: 1 Procedure OT Evaluation $OT Eval Moderate Complexity: 1 Procedure G-CodesNorman Herrlich, OTR/L [phone] 03/02/2016, 5:11 PM

## 2016-03-03 LAB — CBC
HCT: 36.7 % — ABNORMAL LOW (ref 39.0–52.0)
HEMOGLOBIN: 12.4 g/dL — AB (ref 13.0–17.0)
MCH: 31 pg (ref 26.0–34.0)
MCHC: 33.8 g/dL (ref 30.0–36.0)
MCV: 91.8 fL (ref 78.0–100.0)
PLATELETS: 194 10*3/uL (ref 150–400)
RBC: 4 MIL/uL — ABNORMAL LOW (ref 4.22–5.81)
RDW: 13.6 % (ref 11.5–15.5)
WBC: 11.3 10*3/uL — ABNORMAL HIGH (ref 4.0–10.5)

## 2016-03-03 MED ORDER — TAMSULOSIN HCL 0.4 MG PO CAPS
0.4000 mg | ORAL_CAPSULE | Freq: Every day | ORAL | 1 refills | Status: DC
Start: 2016-03-04 — End: 2020-01-25

## 2016-03-03 NOTE — Progress Notes (Signed)
Occupational Therapy Treatment Patient Details Name: Danny Cobb MRN: [id number] DOB: 14-Nov-1945 Today's Date: 03/03/2016    History of present illness Pt is a 71 y.o. male s/p L TKA. Pt has a PMH significant for anxiety, arthritis, cataract in R eye, colon polyps, elevated PSA, hypertension, tubular adenoma, and lipoma of neck.   OT comments  Pt progressing well toward OT goals. Educated pt on safe shower transfer with 3-in-1 and he was able to complete with min assist this session. He demonstrates improved independence with toilet transfers requiring supervision this session. Continue to recommend 24 hour assistance post-acute D/C and pt reports understanding and reports that he is arranging to have assistance. OT will continue to follow acutely.   Follow Up Recommendations  Home health OT;Supervision/Assistance - 24 hour    Equipment Recommendations  3 in 1 bedside commode    Recommendations for Other Services      Precautions / Restrictions Precautions Precautions: Knee;Fall Precaution Booklet Issued: Yes (comment) Precaution Comments: reviewed no pillow under knee and precautions during ADL Required Braces or Orthoses: Knee Immobilizer - Left Knee Immobilizer - Left: On at all times Restrictions Weight Bearing Restrictions: Yes LLE Weight Bearing: Weight bearing as tolerated       Mobility Bed Mobility Overal bed mobility: Needs Assistance Bed Mobility: Supine to Sit     Supine to sit: Supervision     General bed mobility comments: Received in recliner  Transfers Overall transfer level: Needs assistance Equipment used: Rolling walker (2 wheeled) Transfers: Sit to/from Stand Sit to Stand: Supervision         General transfer comment: Supervision for safety    Balance Overall balance assessment: Needs assistance Sitting-balance support: No upper extremity supported;Feet supported Sitting balance-Leahy Scale: Good Sitting balance - Comments: sitting  EOB no back support   Standing balance support: No upper extremity supported;During functional activity;Bilateral upper extremity supported Standing balance-Leahy Scale: Fair Standing balance comment: Able to statically stand for ADL tasks without UE support.                   ADL Overall ADL's : Needs assistance/impaired                         Toilet Transfer: Ambulation;RW;BSC;Supervision/safety       Tub/ Shower Transfer: Walk-in shower;Minimal assistance;Ambulation;3 in 1;Rolling walker   Functional mobility during ADLs: Rolling walker;Supervision/safety General ADL Comments: Educated pt on safe shower transfer and use of 3-in-1 as a shower seat. Continued education concerning dressing techniques, safe toilet transfers, and fall prevention at home.      Vision                     Perception     Praxis      Cognition   Behavior During Therapy: [facility] for tasks assessed/performed Overall Cognitive Status: Within Functional Limits for tasks assessed                       Extremity/Trunk Assessment               Exercises Total Joint Exercises Short Arc Quad: AAROM;Left;10 reps;Seated Long Arc Quad: AAROM;Left;10 reps;Supine Knee Flexion: AROM;AAROM;Left;10 reps;Seated Goniometric ROM: 5-75   Shoulder Instructions       General Comments      Pertinent Vitals/ Pain       Pain Assessment: Faces Pain Score: 2  Faces Pain Scale: Hurts little more  Pain Location: left knee Pain Descriptors / Indicators: Dull Pain Intervention(s): Limited activity within patient's tolerance;Monitored during session;Repositioned;Ice applied  Home Living                                          Prior Functioning/Environment              Frequency  Min 2X/week        Progress Toward Goals  OT Goals(current goals can now be found in the care plan section)  Progress towards OT goals: Progressing toward  goals  Acute Rehab OT Goals Patient Stated Goal: to get back to working a few days per week OT Goal Formulation: With patient Time For Goal Achievement: 03/09/16 Potential to Achieve Goals: Good ADL Goals Pt Will Perform Grooming: with modified independence;standing Pt Will Perform Lower Body Bathing: with modified independence;sit to/from stand (with or without AE) Pt Will Perform Lower Body Dressing: with modified independence;sit to/from stand (with or without AE) Pt Will Transfer to Toilet: with modified independence;ambulating;bedside commode (BSC over toilet) Pt Will Perform Toileting - Clothing Manipulation and hygiene: with modified independence;sit to/from stand Pt Will Perform Tub/Shower Transfer: Tub transfer;with modified independence;3 in 1;ambulating;rolling walker  Plan Discharge plan remains appropriate    Co-evaluation                 End of Session Equipment Utilized During Treatment: Gait belt;Rolling walker   Activity Tolerance Patient tolerated treatment well   Patient Left in chair;with call bell/phone within reach   Nurse Communication          Time: [id number] OT Time Calculation (min): 17 min  Charges: OT General Charges $OT Visit: 1 Procedure OT Treatments $Self Care/Home Management : 8-22 mins  Norman Herrlich, OTR/L [phone] 03/03/2016, 10:43 AM

## 2016-03-03 NOTE — Progress Notes (Signed)
  PATIENT ID: Danny Cobb  MRN: [id number]  DOB/AGE:  November 18, 1945 / 71 y.o.  2 Days Post-Op Procedure(s) (LRB): TOTAL KNEE ARTHROPLASTY (Left)  Subjective: Pain is moderate.  No c/o chest pain or SOB.  + flatus, +tol PO liquids.   Having continued postop urinary retention  Objective: Vital signs in last 24 hours: Temp:  [98 F (36.7 C)-99.5 F (37.5 C)] 98 F (36.7 C) (01/21 0623) Pulse Rate:  [68-75] 75 (01/21 0623) BP: (146-149)/(61-81) 146/61 (01/21 0623) SpO2:  [92 %-94 %] 92 % (01/21 0623)  Intake/Output from previous day: 01/20 0701 - 01/21 0700 In: 3053.3 [P.O.:1360; I.V.:1693.3] Out: 1700 [Urine:1700] Intake/Output this shift: Total I/O In: 480 [P.O.:480] Out: -    Recent Labs  03/02/16 0424 03/03/16 0537  HGB 13.8 12.4*    Recent Labs  03/02/16 0424 03/03/16 0537  WBC 11.7* 11.3*  RBC 4.50 4.00*  HCT 41.7 36.7*  PLT 217 194    Recent Labs  03/02/16 0424  NA 139  K 4.4  CL 107  CO2 25  BUN 13  CREATININE 1.56*  GLUCOSE 144*  CALCIUM 8.9   No results for input(s): LABPT, INR in the last 72 hours.  Physical Exam: ABD soft Sensation intact distally Intact pulses distally Dorsiflexion/Plantar flexion intact Incision: dressing C/D/I Compartment soft  Assessment/Plan: 2 Days Post-Op Procedure(s) (LRB): TOTAL KNEE ARTHROPLASTY (Left)   Advance diet Weight Bearing as Tolerated (WBAT) VTE prophylaxis: ASA & SCD Saline lock IVFs, Bladder scan with I&O cath if >400--->last 3 scans have been 750,950, and just now 674. Placed on flomax y'day. Will leave foley and plan on d/c to home with foley. Contacted Urology on-call who will make arrangements for voiding trial for later this week.  Danny Cobb, Danny Cobb Danny Cobb, Danny Cobb  [id number] Office: [phone] Mobile: [phone]  03/03/2016, 12:07 PM

## 2016-03-03 NOTE — Treatment Plan (Signed)
Discussed post-operative urinary retention with Dr. [name]. Agree with leaving foley catheter in place. Will keep on flomax and plan for trial of void in approximately 3 days. Will have office call him Monday morning to arrange this. Expect retention to resolve with increased mobility and decreased narcotic usage.

## 2016-03-03 NOTE — Progress Notes (Signed)
Physical Therapy Treatment Patient Details Name: Danny Cobb MRN: [id number] DOB: 02/19/1945 Today's Date: 03/03/2016    History of Present Illness Pt is a 71 y.o. male s/p L TKA. Pt has a PMH significant for anxiety, arthritis, cataract in R eye, colon polyps, elevated PSA, hypertension, tubular adenoma, and lipoma of neck.    PT Comments    Pt is POD 2 and continues to be moving well with therapy. Pt requires some increased time this session to perform mobility, but is able to perform bed mobs, transfers and gait with Min guard to supervision. Pain is minimal at 2/10 initially but increases with increased gait distance. Measurements taken and recorded below. Pt will need to perform stair negotiation prior to discharge and will attempt this afternoon.    Follow Up Recommendations  Home health PT;Supervision - Intermittent     Equipment Recommendations  Rolling walker with 5" wheels;3in1 (PT)    Recommendations for Other Services       Precautions / Restrictions Precautions Precautions: Knee;Fall Precaution Booklet Issued: Yes (comment) Precaution Comments: Handout given and reviewed no pillow under knee Required Braces or Orthoses: Knee Immobilizer - Left Knee Immobilizer - Left: On at all times Restrictions Weight Bearing Restrictions: Yes LLE Weight Bearing: Weight bearing as tolerated    Mobility  Bed Mobility Overal bed mobility: Needs Assistance Bed Mobility: Supine to Sit     Supine to sit: Supervision     General bed mobility comments: Supervision for safety. Performs all mobility independently  Transfers Overall transfer level: Needs assistance Equipment used: Rolling walker (2 wheeled) Transfers: Sit to/from Stand Sit to Stand: Supervision         General transfer comment: Supervision for safety  Ambulation/Gait Ambulation/Gait assistance: Min guard Ambulation Distance (Feet): 150 Feet Assistive device: Rolling walker (2 wheeled) Gait  Pattern/deviations: Step-through pattern;Decreased step length - right;Decreased stance time - left;Antalgic Gait velocity: decreased Gait velocity interpretation: Below normal speed for age/gender General Gait Details: Mild antalgic gait, min cues for sequencing   Stairs            Wheelchair Mobility    Modified Rankin (Stroke Patients Only)       Balance Overall balance assessment: Needs assistance Sitting-balance support: No upper extremity supported;Feet supported Sitting balance-Leahy Scale: Good Sitting balance - Comments: sitting EOB no back support   Standing balance support: No upper extremity supported Standing balance-Leahy Scale: Fair Standing balance comment: Able to stand to attempt to use bathroom and to wash hands at sink without support                    Cognition Arousal/Alertness: Awake/alert Behavior During Therapy: WFL for tasks assessed/performed Overall Cognitive Status: Within Functional Limits for tasks assessed                      Exercises Total Joint Exercises Short Arc Quad: AAROM;Left;10 reps;Seated Long Arc Quad: AAROM;Left;10 reps;Supine Knee Flexion: AROM;AAROM;Left;10 reps;Seated Goniometric ROM: 5-75    General Comments        Pertinent Vitals/Pain Pain Assessment: 0-10 Pain Score: 2  Pain Location: left knee Pain Descriptors / Indicators: Dull Pain Intervention(s): Limited activity within patient's tolerance;Monitored during session;RN gave pain meds during session;Ice applied    Home Living                      Prior Function            PT Goals (current  goals can now be found in the care plan section) Acute Rehab PT Goals Patient Stated Goal: to get back to working a few days per week Progress towards PT goals: Progressing toward goals    Frequency    7X/week      PT Plan Current plan remains appropriate    Co-evaluation             End of Session Equipment Utilized  During Treatment: Gait belt;Left knee immobilizer Activity Tolerance: Patient tolerated treatment well Patient left: in chair;with call bell/phone within reach     Time: 0840-0920 PT Time Calculation (min) (ACUTE ONLY): 40 min  Charges:  $Gait Training: 23-37 mins $Therapeutic Exercise: 8-22 mins                    G Codes:      Scheryl Marten PT, DPT  [phone]  03/03/2016, 9:24 AM

## 2016-03-03 NOTE — Progress Notes (Signed)
Physical Therapy Treatment Patient Details Name: Danny Cobb MRN: [id number] DOB: 1945-09-05 Today's Date: 03/03/2016    History of Present Illness Pt is a 71 y.o. male s/p L TKA. Pt has a PMH significant for anxiety, arthritis, cataract in R eye, colon polyps, elevated PSA, hypertension, tubular adenoma, and lipoma of neck.    PT Comments    Pt continues to be moving well with therapy. Has a foley catheter and will have to return home with this. Pt is assisted with dressing with catheter in place and performed stair negotiation training and gait with supervision required.    Follow Up Recommendations  Home health PT;Supervision - Intermittent     Equipment Recommendations  Rolling walker with 5" wheels;3in1 (PT)    Recommendations for Other Services       Precautions / Restrictions Precautions Precautions: Knee;Fall Precaution Booklet Issued: Yes (comment) Precaution Comments: reviewed no pillow under knee and precautions during ADL Required Braces or Orthoses: Knee Immobilizer - Left Knee Immobilizer - Left: On at all times Restrictions Weight Bearing Restrictions: Yes LLE Weight Bearing: Weight bearing as tolerated    Mobility  Bed Mobility Overal bed mobility: Needs Assistance Bed Mobility: Supine to Sit     Supine to sit: Supervision        Transfers Overall transfer level: Needs assistance Equipment used: Rolling walker (2 wheeled) Transfers: Sit to/from Stand Sit to Stand: Supervision         General transfer comment: Supervision for safety  Ambulation/Gait Ambulation/Gait assistance: Supervision Ambulation Distance (Feet): 150 Feet Assistive device: Rolling walker (2 wheeled) Gait Pattern/deviations: Step-through pattern;Decreased step length - right;Decreased stance time - left;Antalgic Gait velocity: decreased Gait velocity interpretation: Below normal speed for age/gender General Gait Details: Mild antalgic gait, min cues for  sequencing   Stairs Stairs: Yes   Stair Management: No rails;Backwards;With walker Number of Stairs: 2 General stair comments: Supervision for safety and min cues for sequencing  Wheelchair Mobility    Modified Rankin (Stroke Patients Only)       Balance                                    Cognition Arousal/Alertness: Awake/alert Behavior During Therapy: WFL for tasks assessed/performed Overall Cognitive Status: Within Functional Limits for tasks assessed                      Exercises      General Comments General comments (skin integrity, edema, etc.): Assisted pt with dressing. Able to perform majoritity of mobility without assistance.       Pertinent Vitals/Pain Pain Assessment: 0-10 Pain Score: 3  Pain Location: left knee Pain Descriptors / Indicators: Dull Pain Intervention(s): Monitored during session;Ice applied    Home Living                      Prior Function            PT Goals (current goals can now be found in the care plan section) Acute Rehab PT Goals Patient Stated Goal: to get back to working a few days per week Progress towards PT goals: Progressing toward goals    Frequency    7X/week      PT Plan Current plan remains appropriate    Co-evaluation             End of Session Equipment Utilized During Treatment:  Gait belt;Left knee immobilizer Activity Tolerance: Patient tolerated treatment well Patient left: in chair;with call bell/phone within reach;with family/visitor present     Time: [id number] PT Time Calculation (min) (ACUTE ONLY): 24 min  Charges:  $Gait Training: 8-22 mins $Therapeutic Activity: 8-22 mins                    G Codes:      Scheryl Marten PT, DPT  [phone]  03/03/2016, 2:46 PM

## 2016-03-03 NOTE — Brief Op Note (Signed)
03/01/2016  10:20 PM  PATIENT:  Danny Cobb  71 y.o. male  PRE-OPERATIVE DIAGNOSIS:  OSTEOARTHRITIS LEFT KNEE  POST-OPERATIVE DIAGNOSIS:  OSTEOARTHRITIS LEFT KNEE  PROCEDURE:  Procedure(s): TOTAL KNEE ARTHROPLASTY (Left)  SURGEON:  Surgeon(s) and Role:    * Dorna Leitz, MD - Primary  PHYSICIAN ASSISTANT:   ASSISTANTS: bethune pac   ANESTHESIA:   spinal  EBL:  No intake/output data recorded.  BLOOD ADMINISTERED:none  DRAINS: none   LOCAL MEDICATIONS USED:  MARCAINE    and OTHER experel   SPECIMEN:  No Specimen  DISPOSITION OF SPECIMEN:  N/A  COUNTS:  YES  TOURNIQUET:   Total Tourniquet Time Documented: Thigh (Left) - 74 minutes Total: Thigh (Left) - 74 minutes   DICTATION: .Other Dictation: Dictation Number [id number]  PLAN OF CARE: Admit to inpatient   PATIENT DISPOSITION:  PACU - hemodynamically stable.   Delay start of Pharmacological VTE agent (>24hrs) due to surgical blood loss or risk of bleeding: no

## 2016-03-03 NOTE — Progress Notes (Signed)
Pt has attempted to void several times during the evening. Bladder scan showed 600cc of urine. Dr. [name] notified. Flomax ordered.Pt catheterized without difficulty and 950 cc of urine drained.  I&O cath in am if pt still unable to void.

## 2016-03-04 NOTE — Op Note (Signed)
NAME:  Danny Cobb, Danny Cobb NO.:  [id number]  MEDICAL RECORD NO.:  [id number]  LOCATION:                                FACILITY:  [facility]  PHYSICIAN:  Alta Corning, M.D.   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  PROCEDURE:  Left total knee replacement with a size 9 tibia, size 8 femur, a 5 mm bridging bearing, and a 38-mm all polyethylene patella.  SURGEON:  Alta Corning, M.D.  ASSISTANT:  Gary Fleet, P.A.  ANESTHESIA:  General.  BRIEF HISTORY:  Ms. Kenaston is a 71 year old male with a long history of significant complaints of left knee pain.  He had been treated conservatively for a prolonged period of time.  After failure of all conservative care and the patient was having night pain and light activity pain with x-ray showing bone-on-bone change, he was taken to the operating room for left total knee replacement.  DESCRIPTION OF PROCEDURE:  The patient was taken to the operating room. After adequate anesthesia was obtained with general anesthetic, the patient was placed supine on the operating table.  Left leg was prepped and draped in usual sterile fashion.  Following this, the leg was exsanguinated, blood pressure tourniquet inflated to 300 mmHg. Following this, a midline incision was made in subcutaneous tissues and dissected down to the level of the extensor mechanism.  A medial parapatellar arthrotomy was undertaken.  Once this was completed, attention was turned to the knee joint where the retropatellar fat pad __________ femur, medial and lateral menisci, and anterior and posterior cruciates were excised.  Following this, an intramedullary pilot  hole was drilled and a 4-degree valgus was inclined and cut was made with 9 mm of distal bone resected.  Once this was done, attention turned towards the distal cut.  A distal cut was made.  The femur was sized to an 8.  Anterior and posterior cuts were made, chamfers and box. Attention was then turned to the tibia which was cut perpendicular to its long axis and following this, it was sized to a 9, drilled and keeled.  Trials were put in place with a 5 spacer.  Excellent full range of motion and stability were achieved.  Attention turned to the patella, cut down to the level of 13 mm and a 38 paddle was chosen and lugs were drilled.  The knee was now put through a range of motion with excellent stability and range of motion achieved.  Following this, all trial components were removed.  The knee was then copiously and thoroughly lavaged, pulsatile lavage, irrigation and suctioned dry.  The final components were then cemented into place with a size 9 tibia, size 8 femur, a 5-mm bridging bearing trial was placed with a 38 all poly patella placed and held with a clamp.  All excess bone cement was removed and the cement allowed to completely harden.  Once so, the tourniquet was let  down.  All bleeding was controlled with electrocautery.  The final poly was put into place and the medial parapatellar arthrotomy was closed with a 1 Vicryl running, skin with 0 and 2-0 Vicryl and 3-0 Monocryl subcuticular.  Benzoin and Steri-Strips were applied.  Sterile compressive dressing was applied.  The patient was taken to the recovery where he was noted to be in satisfactory condition.  Estimated blood loss for the procedure being minimal.     Alta Corning, M.D.   ______________________________ Alta Corning, M.D.    Corliss Skains  D:  03/03/2016  T:  03/04/2016  Job:  [id number]

## 2016-03-05 ENCOUNTER — Encounter (HOSPITAL_COMMUNITY): Payer: Self-pay | Admitting: Orthopedic Surgery

## 2016-03-08 DIAGNOSIS — R338 Other retention of urine: Secondary | ICD-10-CM

## 2016-03-08 NOTE — Discharge Summary (Signed)
Patient ID: Danny Cobb MRN: [id number] DOB/AGE: 03/31/1945 71 y.o.  Admit date: 03/01/2016 Discharge date: 03/03/2016 Admission Diagnoses:  Active Problems:   Primary osteoarthritis of left knee   Acute urinary retention   Discharge Diagnoses:  Same  Past Medical History:  Diagnosis Date  . Anxiety   . Arthritis   . Cataract    right eye  . Colon polyps   . Elevated PSA   . History of kidney stones    over 10 yrs ago  . Hypertension 12-20-10   tx. Lotrel  . Hypertrophy of prostate without urinary obstruction and other lower urinary tract symptoms (LUTS)   . Lipoma of neck October 2012   posterior neck lesion   . Other malaise and fatigue   . Tubular adenoma 09/2010    Surgeries: Procedure(s):left TOTAL KNEE ARTHROPLASTY on 03/01/2016   Consultants: Urology  DrSukhu  Discharged Condition: Improved  Hospital Course: Mclane H Mauney is an 71 y.o. male who was admitted 03/01/2016 for operative treatment of<principal problem not specified>. Patient has severe unremitting pain that affects sleep, daily activities, and work/hobbies. After pre-op clearance the patient was taken to the operating room on 03/01/2016 and underwent  Procedure(s):Left TOTAL KNEE ARTHROPLASTY.    Patient was given perioperative antibiotics:  Anti-infectives    Start     Dose/Rate Route Frequency Ordered Stop   03/01/16 1700  ceFAZolin (ANCEF) IVPB 2g/100 mL premix     2 g 200 mL/hr over 30 Minutes Intravenous Every 6 hours 03/01/16 1626 03/02/16 0246   03/01/16 0917  ceFAZolin (ANCEF) IVPB 2g/100 mL premix     2 g 200 mL/hr over 30 Minutes Intravenous On call to O.R. 03/01/16 0917 03/01/16 1045       Patient was given sequential compression devices, early ambulation, and chemoprophylaxis to prevent DVT.The patient had postoperative urinary retention and had to be in and out cathed 3. He was started on oral Flomax. Dr. Thompson who was managing the patient spoke with urology who suggested  leaving a Foley catheter in and doing a voiding trial on an outpatient basis in 3 days. He was also continued on oral Flomax. On the date of discharge the patient was afebrile his vital signs are stable his left knee dressing was clean and dry his calf is soft. He had made good progress with physical therapy  Patient benefited maximally from hospital stay and there were no complications.    Recent vital signs: See chart for details   Recent laboratory studies:  Recent Results (from the past 2160 hour(s))  Surgical pcr screen     Status: Abnormal   Collection Time: 02/22/16 10:07 AM  Result Value Ref Range   MRSA, PCR NEGATIVE NEGATIVE   Staphylococcus aureus POSITIVE (A) NEGATIVE    Comment:        The Xpert SA Assay (FDA approved for NASAL specimens in patients over 21 years of age), is one component of a comprehensive surveillance program.  Test performance has been validated by [facility] for patients greater than or equal to 1 year old. It is not intended to diagnose infection nor to guide or monitor treatment.   Urinalysis, Routine w reflex microscopic     Status: None   Collection Time: 02/22/16 10:07 AM  Result Value Ref Range   Color, Urine YELLOW YELLOW   APPearance CLEAR CLEAR   Specific Gravity, Urine 1.021 1.005 - 1.030   pH 5.0 5.0 - 8.0   Glucose, UA NEGATIVE NEGATIVE mg/dL     Hgb urine dipstick NEGATIVE NEGATIVE   Bilirubin Urine NEGATIVE NEGATIVE   Ketones, ur NEGATIVE NEGATIVE mg/dL   Protein, ur NEGATIVE NEGATIVE mg/dL   Nitrite NEGATIVE NEGATIVE   Leukocytes, UA NEGATIVE NEGATIVE  CBC WITH DIFFERENTIAL     Status: None   Collection Time: 02/22/16 10:08 AM  Result Value Ref Range   WBC 7.6 4.0 - 10.5 K/uL   RBC 4.88 4.22 - 5.81 MIL/uL   Hemoglobin 15.5 13.0 - 17.0 g/dL   HCT 45.1 39.0 - 52.0 %   MCV 92.4 78.0 - 100.0 fL   MCH 31.8 26.0 - 34.0 pg   MCHC 34.4 30.0 - 36.0 g/dL   RDW 13.8 11.5 - 15.5 %   Platelets 231 150 - 400 K/uL   Neutrophils  Relative % 58 %   Neutro Abs 4.5 1.7 - 7.7 K/uL   Lymphocytes Relative 30 %   Lymphs Abs 2.3 0.7 - 4.0 K/uL   Monocytes Relative 8 %   Monocytes Absolute 0.6 0.1 - 1.0 K/uL   Eosinophils Relative 3 %   Eosinophils Absolute 0.2 0.0 - 0.7 K/uL   Basophils Relative 1 %   Basophils Absolute 0.0 0.0 - 0.1 K/uL  Comprehensive metabolic panel     Status: Abnormal   Collection Time: 02/22/16 10:08 AM  Result Value Ref Range   Sodium 139 135 - 145 mmol/L   Potassium 4.0 3.5 - 5.1 mmol/L   Chloride 106 101 - 111 mmol/L   CO2 25 22 - 32 mmol/L   Glucose, Bld 112 (H) 65 - 99 mg/dL   BUN 12 6 - 20 mg/dL   Creatinine, Ser 0.99 0.61 - 1.24 mg/dL   Calcium 9.6 8.9 - 10.3 mg/dL   Total Protein 7.1 6.5 - 8.1 g/dL   Albumin 4.1 3.5 - 5.0 g/dL   AST 22 15 - 41 U/L   ALT 25 17 - 63 U/L   Alkaline Phosphatase 67 38 - 126 U/L   Total Bilirubin 0.7 0.3 - 1.2 mg/dL   GFR calc non Af Amer >60 >60 mL/min   GFR calc Af Amer >60 >60 mL/min    Comment: (NOTE) The eGFR has been calculated using the CKD EPI equation. This calculation has not been validated in all clinical situations. eGFR's persistently <60 mL/min signify possible Chronic Kidney Disease.    Anion gap 8 5 - 15  Protime-INR     Status: None   Collection Time: 02/22/16 10:08 AM  Result Value Ref Range   Prothrombin Time 13.8 11.4 - 15.2 seconds   INR 1.06   Type and screen Order type and screen if day of surgery is less than 15 days from draw of preadmission visit or order morning of surgery if day of surgery is greater than 6 days from preadmission visit.     Status: None   Collection Time: 02/22/16 10:22 AM  Result Value Ref Range   ABO/RH(D) A POS    Antibody Screen NEG    Sample Expiration 03/07/2016    Extend sample reason NO TRANSFUSIONS OR PREGNANCY IN THE PAST 3 MONTHS   ABO/Rh     Status: None   Collection Time: 02/22/16 10:22 AM  Result Value Ref Range   ABO/RH(D) A POS   CBC     Status: Abnormal   Collection Time:  03/02/16  4:24 AM  Result Value Ref Range   WBC 11.7 (H) 4.0 - 10.5 K/uL   RBC 4.50 4.22 - 5.81 MIL/uL  Hemoglobin 13.8 13.0 - 17.0 g/dL   HCT 41.7 39.0 - 52.0 %   MCV 92.7 78.0 - 100.0 fL   MCH 30.7 26.0 - 34.0 pg   MCHC 33.1 30.0 - 36.0 g/dL   RDW 13.7 11.5 - 15.5 %   Platelets 217 150 - 400 K/uL  Basic metabolic panel     Status: Abnormal   Collection Time: 03/02/16  4:24 AM  Result Value Ref Range   Sodium 139 135 - 145 mmol/L   Potassium 4.4 3.5 - 5.1 mmol/L   Chloride 107 101 - 111 mmol/L   CO2 25 22 - 32 mmol/L   Glucose, Bld 144 (H) 65 - 99 mg/dL   BUN 13 6 - 20 mg/dL   Creatinine, Ser 1.56 (H) 0.61 - 1.24 mg/dL   Calcium 8.9 8.9 - 10.3 mg/dL   GFR calc non Af Amer 43 (L) >60 mL/min   GFR calc Af Amer 49 (L) >60 mL/min    Comment: (NOTE) The eGFR has been calculated using the CKD EPI equation. This calculation has not been validated in all clinical situations. eGFR's persistently <60 mL/min signify possible Chronic Kidney Disease.    Anion gap 7 5 - 15  CBC     Status: Abnormal   Collection Time: 03/03/16  5:37 AM  Result Value Ref Range   WBC 11.3 (H) 4.0 - 10.5 K/uL   RBC 4.00 (L) 4.22 - 5.81 MIL/uL   Hemoglobin 12.4 (L) 13.0 - 17.0 g/dL   HCT 36.7 (L) 39.0 - 52.0 %   MCV 91.8 78.0 - 100.0 fL   MCH 31.0 26.0 - 34.0 pg   MCHC 33.8 30.0 - 36.0 g/dL   RDW 13.6 11.5 - 15.5 %   Platelets 194 150 - 400 K/uL    Discharge Medications:   Allergies as of 03/03/2016   No Known Allergies     Medication List    STOP taking these medications   acetaminophen 325 MG tablet Commonly known as:  TYLENOL   aspirin 81 MG tablet Replaced by:  aspirin EC 325 MG tablet     TAKE these medications   amLODipine-benazepril 5-10 MG capsule Commonly known as:  LOTREL Take 1 capsule by mouth daily.   aspirin EC 325 MG tablet Take 1 tablet (325 mg total) by mouth 2 (two) times daily after a meal. Take x 1 month post op to decrease risk of blood clots. Replaces:  aspirin  81 MG tablet   docusate sodium 100 MG capsule Commonly known as:  COLACE Take 1 capsule (100 mg total) by mouth 2 (two) times daily.   hydrocortisone cream 1 % Apply 1 application topically daily as needed (rash).   oxyCODONE-acetaminophen 5-325 MG tablet Commonly known as:  PERCOCET/ROXICET Take 1-2 tablets by mouth every 6 (six) hours as needed for severe pain.   tamsulosin 0.4 MG Caps capsule Commonly known as:  FLOMAX Take 1 capsule (0.4 mg total) by mouth daily.   tiZANidine 2 MG tablet Commonly known as:  ZANAFLEX Take 1 tablet (2 mg total) by mouth every 8 (eight) hours as needed for muscle spasms.       Diagnostic Studies: Dg Chest 2 View  Result Date: 02/22/2016 CLINICAL DATA:  Preoperative knee arthroplasty EXAM: CHEST  2 VIEW COMPARISON:  None. FINDINGS: Lungs are clear. Heart size and pulmonary vascularity are normal. No adenopathy. There is atherosclerotic calcification in the aorta. There is mild degenerative change in the thoracic spine. IMPRESSION: No edema or   consolidation.  Aortic atherosclerosis. Electronically Signed   By: William  Woodruff III M.D.   On: 02/22/2016 10:37    Disposition: 01-Home or Self Care  Discharge Instructions    Weight bearing as tolerated    Complete by:  As directed    Laterality:  left   Extremity:  Lower      Follow-up Information    GRAVES,JOHN L, MD. Schedule an appointment as soon as possible for a visit in 2 week(s).   Specialty:  Orthopedic Surgery Contact information: [address] [place] [address] [phone]        [facility] Follow up.   Why:  Someone from [facility] will contact you to arrange start date and time for therapy. Contact information: [address] [place] [address] [phone]        Brian Mariana Budzyn, MD Follow up in 3 day(s).   Specialty:  Urology Contact information: [address] [place]  [address] [phone]             Signed: BETHUNE,Eldwin G 03/08/2016, 12:56 PM   

## 2016-03-15 ENCOUNTER — Encounter (HOSPITAL_COMMUNITY): Payer: Self-pay | Admitting: *Deleted

## 2016-03-15 ENCOUNTER — Observation Stay (HOSPITAL_COMMUNITY)
Admission: EM | Admit: 2016-03-15 | Discharge: 2016-03-18 | Disposition: A | Discharge status: Home / Self Care | Payer: Medicare Other | Attending: Internal Medicine | Admitting: Internal Medicine

## 2016-03-15 DIAGNOSIS — T83511A Infection and inflammatory reaction due to indwelling urethral catheter, initial encounter: Secondary | ICD-10-CM | POA: Diagnosis not present

## 2016-03-15 DIAGNOSIS — E876 Hypokalemia: Secondary | ICD-10-CM | POA: Clinically undetermined

## 2016-03-15 DIAGNOSIS — E860 Dehydration: Secondary | ICD-10-CM

## 2016-03-15 DIAGNOSIS — I951 Orthostatic hypotension: Secondary | ICD-10-CM | POA: Diagnosis present

## 2016-03-15 DIAGNOSIS — N390 Urinary tract infection, site not specified: Secondary | ICD-10-CM | POA: Diagnosis not present

## 2016-03-15 DIAGNOSIS — I10 Essential (primary) hypertension: Secondary | ICD-10-CM | POA: Diagnosis present

## 2016-03-15 DIAGNOSIS — R509 Fever, unspecified: Secondary | ICD-10-CM | POA: Diagnosis present

## 2016-03-15 DIAGNOSIS — N401 Enlarged prostate with lower urinary tract symptoms: Secondary | ICD-10-CM | POA: Diagnosis not present

## 2016-03-15 DIAGNOSIS — Z7982 Long term (current) use of aspirin: Secondary | ICD-10-CM | POA: Insufficient documentation

## 2016-03-15 DIAGNOSIS — Z79899 Other long term (current) drug therapy: Secondary | ICD-10-CM | POA: Diagnosis not present

## 2016-03-15 DIAGNOSIS — Z96652 Presence of left artificial knee joint: Secondary | ICD-10-CM | POA: Diagnosis not present

## 2016-03-15 DIAGNOSIS — R338 Other retention of urine: Secondary | ICD-10-CM | POA: Diagnosis present

## 2016-03-15 LAB — CBC WITH DIFFERENTIAL/PLATELET
BASOS ABS: 0 10*3/uL (ref 0.0–0.1)
Basophils Relative: 0 %
Eosinophils Absolute: 0 10*3/uL (ref 0.0–0.7)
Eosinophils Relative: 0 %
HEMATOCRIT: 36.4 % — AB (ref 39.0–52.0)
HEMOGLOBIN: 12.1 g/dL — AB (ref 13.0–17.0)
LYMPHS PCT: 4 %
Lymphs Abs: 0.6 10*3/uL — ABNORMAL LOW (ref 0.7–4.0)
MCH: 30.6 pg (ref 26.0–34.0)
MCHC: 33.2 g/dL (ref 30.0–36.0)
MCV: 91.9 fL (ref 78.0–100.0)
Monocytes Absolute: 0.8 10*3/uL (ref 0.1–1.0)
Monocytes Relative: 5 %
NEUTROS ABS: 13.6 10*3/uL — AB (ref 1.7–7.7)
NEUTROS PCT: 91 %
Platelets: 312 10*3/uL (ref 150–400)
RBC: 3.96 MIL/uL — AB (ref 4.22–5.81)
RDW: 13.2 % (ref 11.5–15.5)
WBC: 15.1 10*3/uL — AB (ref 4.0–10.5)

## 2016-03-15 LAB — COMPREHENSIVE METABOLIC PANEL
ALT: 27 U/L (ref 17–63)
AST: 30 U/L (ref 15–41)
Albumin: 3.4 g/dL — ABNORMAL LOW (ref 3.5–5.0)
Alkaline Phosphatase: 54 U/L (ref 38–126)
Anion gap: 10 (ref 5–15)
BUN: 19 mg/dL (ref 6–20)
CHLORIDE: 103 mmol/L (ref 101–111)
CO2: 23 mmol/L (ref 22–32)
Calcium: 9.2 mg/dL (ref 8.9–10.3)
Creatinine, Ser: 1.15 mg/dL (ref 0.61–1.24)
Glucose, Bld: 157 mg/dL — ABNORMAL HIGH (ref 65–99)
POTASSIUM: 3.6 mmol/L (ref 3.5–5.1)
SODIUM: 136 mmol/L (ref 135–145)
Total Bilirubin: 0.8 mg/dL (ref 0.3–1.2)
Total Protein: 7 g/dL (ref 6.5–8.1)

## 2016-03-15 LAB — URINALYSIS, ROUTINE W REFLEX MICROSCOPIC
Bilirubin Urine: NEGATIVE
Glucose, UA: NEGATIVE mg/dL
KETONES UR: NEGATIVE mg/dL
Nitrite: POSITIVE — AB
Protein, ur: 100 mg/dL — AB
SPECIFIC GRAVITY, URINE: 1.027 (ref 1.005–1.030)
pH: 5 (ref 5.0–8.0)

## 2016-03-15 LAB — I-STAT CG4 LACTIC ACID, ED
LACTIC ACID, VENOUS: 1.71 mmol/L (ref 0.5–1.9)
Lactic Acid, Venous: 1.2 mmol/L (ref 0.5–1.9)

## 2016-03-15 LAB — PROTIME-INR
INR: 1.21
PROTHROMBIN TIME: 15.4 s — AB (ref 11.4–15.2)

## 2016-03-15 MED ORDER — CEFTRIAXONE SODIUM 1 G IJ SOLR: 1.0000 g | Freq: Once | INTRAMUSCULAR | Status: DC

## 2016-03-15 MED ORDER — SODIUM CHLORIDE 0.9 % IV BOLUS (SEPSIS)
1000.0000 mL | Freq: Once | INTRAVENOUS | Status: AC
  Administered 2016-03-15: 1000 mL via INTRAVENOUS

## 2016-03-15 MED ORDER — DEXTROSE 5 % IV SOLN
1.0000 g | Freq: Once | INTRAVENOUS | Status: AC
  Administered 2016-03-15: 1 g via INTRAVENOUS
  Filled 2016-03-15: qty 10

## 2016-03-15 MED ORDER — SODIUM CHLORIDE 0.9 % IV SOLN
INTRAVENOUS | Status: DC
  Administered 2016-03-15: 23:00:00 via INTRAVENOUS

## 2016-03-15 NOTE — ED Notes (Signed)
Report attempted 

## 2016-03-15 NOTE — H&P (Signed)
History and Physical  Patient Name: Danny Cobb     [id number]    DOB: 11-Apr-1945    DOA: 03/15/2016 PCP: Kandice Hams, MD   Patient coming from: Home --> Dr. Lina Sar office --> ER  Chief Complaint: [name]  HPI: Danny Cobb is a 71 y.o. male with a past medical history significant for HTN and recent TKR who presents with malaise and suspected UTI.  The patient was admitted 1/19-1/21 for LEFT TKR by Dr. Berenice Primas.  He has BPH and hospitalization c/b by urinary retention.  He failed again in Dr. Carlynn Purl office on 1/23 and then again on 1/30, and so his Flomax was doubled and the plan was to follow up on 2/12.    In the meantime, the patient has felt more and more tired.  He initially attributed this to the Flomax increased dose, but today saw his PCP where he had a low grade temp, was tachycardic, orthostatic, and appeared ill and so was sent to the ER to rule out sepsis.  ED course: -Temp 99.40F, heart rate 79, respirations and pulse ox normal, BP 102/81 -Na 136, K 3.6, Cr 1.15 (baseline 1.1), WBC 15.1K, Hgb 12 -lactic acid 1.7 -UA with WBC TNTC and nitrites -No gross hematuria or pain at foley site -Still orthostatic here -he was given ceftriaxone 1g at Dr. Lina Sar office and another 1g here as well as 2L NS and TRH were asked to observe overnight     ROS: Review of Systems  Constitutional: Positive for chills and malaise/fatigue.  Gastrointestinal: Positive for nausea.  Musculoskeletal: Positive for myalgias.  Neurological: Positive for dizziness.  All other systems reviewed and are negative.         Past Medical History:  Diagnosis Date  . Anxiety   . Arthritis   . Cataract    right eye  . Colon polyps   . Elevated PSA   . History of kidney stones    over 10 yrs ago  . Hypertension 12-20-10   tx. Lotrel  . Hypertrophy of prostate without urinary obstruction and other lower urinary tract symptoms (LUTS)   . Lipoma of neck October 2012   posterior  neck lesion   . Other malaise and fatigue   . Tubular adenoma 09/2010    Past Surgical History:  Procedure Laterality Date  . KNEE ARTHROCENTESIS  1989   left knee -scope  . MASS EXCISION  12/24/2010   Procedure: EXCISION MASS;  Surgeon: Imogene Burn. Georgette Dover, MD;  Location: [facility];  Service: General;  Laterality: N/A;  Excision of Sebaceous Masses  . PROSTATE SURGERY  2010   prostate biopsy-last done   . TONSILLECTOMY    . TOTAL KNEE ARTHROPLASTY Left 03/01/2016   Procedure: TOTAL KNEE ARTHROPLASTY;  Surgeon: Dorna Leitz, MD;  Location: [facility];  Service: Orthopedics;  Laterality: Left;    Social History: Patient lives with his wife.  The patient walks unassisted.  He is not a smoker.    No Known Allergies  Family history: family history includes Cancer in his mother.  Prior to Admission medications   Medication Sig Start Date End Date Taking? Authorizing Provider  amLODipine-benazepril (LOTREL) 5-10 MG per capsule Take 1 capsule by mouth daily.    Yes Historical Provider, MD  aspirin EC 325 MG tablet Take 1 tablet (325 mg total) by mouth 2 (two) times daily after a meal. Take x 1 month post op to decrease risk of blood clots. 03/01/16  Yes Gary Fleet, PA-C  docusate sodium (COLACE) 100 MG capsule Take 1 capsule (100 mg total) by mouth 2 (two) times daily. 03/01/16  Yes Gary Fleet, PA-C  hydrocortisone cream 1 % Apply 1 application topically daily as needed (rash).   Yes Historical Provider, MD  oxyCODONE-acetaminophen (PERCOCET/ROXICET) 5-325 MG tablet Take 1-2 tablets by mouth every 6 (six) hours as needed for severe pain. 03/01/16  Yes Gary Fleet, PA-C  tamsulosin (FLOMAX) 0.4 MG CAPS capsule Take 1 capsule (0.4 mg total) by mouth daily. 03/04/16  Yes Milly Jakob, MD  tiZANidine (ZANAFLEX) 2 MG tablet Take 1 tablet (2 mg total) by mouth every 8 (eight) hours as needed for muscle spasms. 03/01/16  Yes Gary Fleet, PA-C       Physical Exam: BP 120/65   Pulse 89   Temp 99.6  F (37.6 C) (Oral)   Resp 18   Ht 5\' 8"  (1.727 m)   Wt 98 kg (216 lb)   SpO2 95%   BMI 32.84 kg/m  General appearance: Well-developed, adult male, alert and in no acute distress, appears tired.   Eyes: Anicteric, conjunctiva pink, lids and lashes normal. PERRL.    ENT: No nasal deformity, discharge, epistaxis.  Hearing normal. OP moist without lesions.   Neck: No neck masses.  Trachea midline.  No thyromegaly/tenderness. Lymph: No cervical or supraclavicular lymphadenopathy. Skin: Warm and dry.  No jaundice.  No suspicious rashes or lesions. Cardiac: RRR, nl S1-S2, no murmurs appreciated.  Capillary refill is brisk.  JVP normal.  No LE edema.  Radial pulses 2+ and symmetric. Respiratory: Normal respiratory rate and rhythm.  CTAB without rales or wheezes. Abdomen: Abdomen soft.  No TTP. No ascites, distension, hepatosplenomegaly.   MSK: No deformities or effusions.  No cyanosis or clubbing.  Left knee without significant pain, no swelling tenderness around incision.   Neuro: Cranial nerves 3-12 intact.  Sensation intact to light touch. Speech is fluent.  Muscle strength normal.    Psych: Sensorium intact and responding to questions, attention normal.  Behavior appropriate.  Affect normal.  Judgment and insight appear normal.     Labs on Admission:  I have personally reviewed following labs and imaging studies: CBC:  Recent Labs Lab 03/15/16 1611  WBC 15.1*  NEUTROABS 13.6*  HGB 12.1*  HCT 36.4*  MCV 91.9  PLT 123456   Basic Metabolic Panel:  Recent Labs Lab 03/15/16 1611  NA 136  K 3.6  CL 103  CO2 23  GLUCOSE 157*  BUN 19  CREATININE 1.15  CALCIUM 9.2   GFR: Estimated Creatinine Clearance: 65.9 mL/min (by C-G formula based on SCr of 1.15 mg/dL).  Liver Function Tests:  Recent Labs Lab 03/15/16 1611  AST 30  ALT 27  ALKPHOS 54  BILITOT 0.8  PROT 7.0  ALBUMIN 3.4*   No results for input(s): LIPASE, AMYLASE in the last 168 hours. No results for input(s):  AMMONIA in the last 168 hours. Coagulation Profile:  Recent Labs Lab 03/15/16 1710  INR 1.21   Cardiac Enzymes: No results for input(s): CKTOTAL, CKMB, CKMBINDEX, TROPONINI in the last 168 hours. BNP (last 3 results) No results for input(s): PROBNP in the last 8760 hours. HbA1C: No results for input(s): HGBA1C in the last 72 hours. CBG: No results for input(s): GLUCAP in the last 168 hours. Lipid Profile: No results for input(s): CHOL, HDL, LDLCALC, TRIG, CHOLHDL, LDLDIRECT in the last 72 hours. Thyroid Function Tests: No results for input(s): TSH, T4TOTAL, FREET4, T3FREE, THYROIDAB in the last 72 hours.  Anemia Panel: No results for input(s): VITAMINB12, FOLATE, FERRITIN, TIBC, IRON, RETICCTPCT in the last 72 hours. Sepsis Labs: Lactic acid 1.7 Invalid input(s): PROCALCITONIN, LACTICIDVEN No results found for this or any previous visit (from the past 240 hour(s)).         Assessment/Plan  1. UTI and orthostasis:  CA-UTI post-operatively, in the setting of BPH and persistent urinary retention.  No history of renal stones, no gross hematuria, no flank pain.   -IVF -Ceftriaxone 1g again tomorrow afternoon, then if improving and no longer orthostatic and tolerating PO, likely home with oral antibiotics and Urology follow up -Replace foley now -Flomax daily   2. Hypertension:  Soft BP at arrival. -Hold Lotrel today  3. Recent TKR:  -Continue hose -Lovenox for ppx while here instead of aspirin, resume aspirin on discharge      DVT prophylaxis: Lovenox  Code Status: FULL  Family Communication: Wife at bedside  Disposition Plan: Anticipate IV fluids and antibiotics tonight and replace foley.  If no fever, BP improves, no orthostasis tomorrow and feeling improved, likely discharge tomorrow after afternoon ceftriaxone. Consults called: None Admission status: OBS At the point of initial evaluation, it is my clinical opinion that admission for OBSERVATION is reasonable  and necessary because the patient's presenting complaints in the context of their chronic conditions represent sufficient risk of deterioration or significant morbidity to constitute reasonable grounds for close observation in the hospital setting, but that the patient may be medically stable for discharge from the hospital within 24 to 48 hours.    Medical decision making: Patient seen at 11:29 PM on 03/15/2016.  The patient was discussed with Dr. Ellender Hose.  What exists of the patient's chart was reviewed in depth and summarized above.  Clinical condition: stable.        Edwin Dada [facility] Pager [phone]

## 2016-03-15 NOTE — ED Provider Notes (Signed)
[facility] DEPT Provider Note   CSN: [id number] Arrival date & time: 03/15/16  1546     History   Chief Complaint Chief Complaint  Patient presents with  . Weakness  . Fever    HPI Danny Cobb Danny Cobb is a 71 y.o. male.  HPI 71 yo M with PMHx of BPH, recent knee surgery here with fever, urinary sediment, and fatigue. Pt reportedly has had increasing fatigue over past several days, along with nausea and decreased appetite. No vomiting. His urine has also become darker and with more sediment. He currently has a foley in place which was placed 2/2 urinary retention after his recent operation. Today, he developed fever and presented to his PCP, who obtained vitals and noted marked tachycardia, hypotension upon standing. He was subsequently sent to ED.  Denies any flank pain. No vomiting. No diarrhea. States he just "feels tired."  Past Medical History:  Diagnosis Date  . Anxiety   . Arthritis   . Cataract    right eye  . Colon polyps   . Elevated PSA   . History of kidney stones    over 10 yrs ago  . Hypertension 12-20-10   tx. Lotrel  . Hypertrophy of prostate without urinary obstruction and other lower urinary tract symptoms (LUTS)   . Lipoma of neck October 2012   posterior neck lesion   . Other malaise and fatigue   . Tubular adenoma 09/2010    Patient Active Problem List   Diagnosis Date Noted  . Acute urinary retention 03/08/2016  . Primary osteoarthritis of left knee 03/01/2016  . Sebaceous cyst - midback - 3 cm 12/13/2010  . Lipoma of scalp - 4 cm 12/13/2010    Past Surgical History:  Procedure Laterality Date  . KNEE ARTHROCENTESIS  1989   left knee -scope  . MASS EXCISION  12/24/2010   Procedure: EXCISION MASS;  Surgeon: Imogene Burn. Georgette Dover, MD;  Location: [facility];  Service: General;  Laterality: N/A;  Excision of Sebaceous Masses  . PROSTATE SURGERY  2010   prostate biopsy-last done   . TONSILLECTOMY    . 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 Authorizing Provider  amLODipine-benazepril (LOTREL) 5-10 MG per capsule Take 1 capsule by mouth daily.     Historical Provider, MD  aspirin EC 325 MG tablet Take 1 tablet (325 mg total) by mouth 2 (two) times daily after a meal. Take x 1 month post op to decrease risk of blood clots. 03/01/16   Danny Fleet, PA-C  docusate sodium (COLACE) 100 MG capsule Take 1 capsule (100 mg total) by mouth 2 (two) times daily. 03/01/16   Danny Fleet, PA-C  hydrocortisone cream 1 % Apply 1 application topically daily as needed (rash).    Historical Provider, MD  oxyCODONE-acetaminophen (PERCOCET/ROXICET) 5-325 MG tablet Take 1-2 tablets by mouth every 6 (six) hours as needed for severe pain. 03/01/16   Danny Fleet, PA-C  tamsulosin (FLOMAX) 0.4 MG CAPS capsule Take 1 capsule (0.4 mg total) by mouth daily. 03/04/16   Danny Jakob, MD  tiZANidine (ZANAFLEX) 2 MG tablet Take 1 tablet (2 mg total) by mouth every 8 (eight) hours as needed for muscle spasms. 03/01/16   Danny Fleet, PA-C    Family History  Family History  Problem Relation Age of Onset  . Cancer Mother     Social History Social History  Substance Use Topics  . Smoking status: Never Smoker  . Smokeless tobacco: Never Used  . Alcohol use Yes     Comment: 1 glass per week     Allergies   Patient has no known allergies.   Review of Systems Review of Systems  Constitutional: Positive for fatigue. Negative for chills and fever.  HENT: Negative for congestion and rhinorrhea.   Eyes: Negative for visual disturbance.  Respiratory: Negative for cough, shortness of breath and wheezing.   Cardiovascular: Negative for chest pain and leg swelling.  Gastrointestinal: Negative for abdominal pain, diarrhea, nausea and  vomiting.  Genitourinary: Positive for dysuria. Negative for flank pain.  Musculoskeletal: Negative for neck pain and neck stiffness.  Skin: Negative for rash and wound.  Allergic/Immunologic: Negative for immunocompromised state.  Neurological: Negative for syncope, weakness and headaches.  All other systems reviewed and are negative.    Physical Exam Updated Vital Signs BP 125/77   Pulse 83   Temp 98.9 F (37.2 C) (Oral)   Resp 18   SpO2 100%   Physical Exam  Constitutional: He is oriented to person, place, and time. He appears well-developed and well-nourished. No distress.  HENT:  Head: Normocephalic and atraumatic.  Mildly dry MM  Eyes: Conjunctivae are normal.  Neck: Neck supple.  Cardiovascular: Normal rate, regular rhythm and normal heart sounds.  Exam reveals no friction rub.   No murmur heard. Pulmonary/Chest: Effort normal and breath sounds normal. No respiratory distress. He has no wheezes. He has no rales.  Abdominal: He exhibits no distension.  Musculoskeletal: He exhibits no edema.  Neurological: He is alert and oriented to person, place, and time. He exhibits normal muscle tone.  Skin: Skin is warm. Capillary refill takes less than 2 seconds.  Psychiatric: He has a normal mood and affect.  Nursing note and vitals reviewed.    ED Treatments / Results  Labs (all labs ordered are listed, but only abnormal results are displayed) Labs Reviewed  COMPREHENSIVE METABOLIC PANEL - Abnormal; Notable for the following:       Result Value   Glucose, Bld 157 (*)    Albumin 3.4 (*)    All other components within normal limits  CBC WITH DIFFERENTIAL/PLATELET - Abnormal; Notable for the following:    WBC 15.1 (*)    RBC 3.96 (*)    Hemoglobin 12.1 (*)    HCT 36.4 (*)    Neutro Abs 13.6 (*)    Lymphs Abs 0.6 (*)    All other components within normal limits  URINALYSIS, ROUTINE W REFLEX MICROSCOPIC - Abnormal; Notable for the following:    Color, Urine AMBER (*)      APPearance CLOUDY (*)    Hgb urine dipstick MODERATE (*)    Protein, ur 100 (*)    Nitrite POSITIVE (*)    Leukocytes, UA LARGE (*)    Bacteria, UA MANY (*)    Squamous Epithelial / LPF 0-5 (*)    All other components within normal limits  PROTIME-INR - Abnormal; Notable for the following:    Prothrombin Time 15.4 (*)    All other components within normal limits  URINE CULTURE  I-STAT CG4 LACTIC ACID, ED    EKG  EKG Interpretation None       Radiology No results found.  Procedures Procedures (including critical care time)  Medications Ordered in ED Medications -  No data to display   Initial Impression / Assessment and Plan / ED Course  I have reviewed the triage vital signs and the nursing notes.  Pertinent labs & imaging results that were available during my care of the patient were reviewed by me and considered in my medical decision making (see chart for details).    71 yo M with PMHx as above including recent foley placement for urinary retention post-op from knee replacement here with fever, confusion, mild hypotension. On arrival, VSS. Exam as above. Pt non-toxic but does appear dry. No overt CVAT. Labs as above, overall reassuring but UA c/w acute UTI, and WBC elevated at 15k. Pt given 2L NS, Rocephin IV and is persistently symptomatic, orthostatic. Will admit for obs, fluids.  Final Clinical Impressions(s) / ED Diagnoses   Final diagnoses:  Lower urinary tract infectious disease  Dehydration    New Prescriptions New Prescriptions   No medications on file     Duffy Bruce, MD 03/16/16 [phone]

## 2016-03-15 NOTE — ED Notes (Signed)
Pt reports he does not feel sturdy when on his feet and did not wish to ambulate.

## 2016-03-15 NOTE — ED Notes (Signed)
Dr. Delfina Redwood called about patient.  States patient had a left total knee replacement recently and is now having malaise, generalized body aches, positive orthostatics and a soft BP.  Patient sent here for rule out sepsis.  Pt given 1g Rocephin at the office because patient has indwelling foley.

## 2016-03-15 NOTE — ED Triage Notes (Addendum)
Pt sent here from dr office. Pt is little over a week post op left knee replacement. Pt reports one week of fatigue, lack of appetite, not drinking, fever. Denies n/v/d. Still has foley in place since surgery. Denies sob. Pt was orthostatic at dr office.

## 2016-03-15 NOTE — ED Notes (Signed)
Pt arrived with foley in place. MD wants foley catheter to be switched out.

## 2016-03-16 DIAGNOSIS — D72829 Elevated white blood cell count, unspecified: Secondary | ICD-10-CM | POA: Diagnosis not present

## 2016-03-16 DIAGNOSIS — N390 Urinary tract infection, site not specified: Secondary | ICD-10-CM

## 2016-03-16 DIAGNOSIS — I10 Essential (primary) hypertension: Secondary | ICD-10-CM | POA: Diagnosis not present

## 2016-03-16 DIAGNOSIS — E876 Hypokalemia: Secondary | ICD-10-CM

## 2016-03-16 LAB — BASIC METABOLIC PANEL
Anion gap: 4 — ABNORMAL LOW (ref 5–15)
BUN: 15 mg/dL (ref 6–20)
CALCIUM: 8.5 mg/dL — AB (ref 8.9–10.3)
CO2: 25 mmol/L (ref 22–32)
CREATININE: 1.07 mg/dL (ref 0.61–1.24)
Chloride: 109 mmol/L (ref 101–111)
GFR calc non Af Amer: >60 mL/min (ref >60)
Glucose, Bld: 132 mg/dL — ABNORMAL HIGH (ref 65–99)
Potassium: 3.4 mmol/L — ABNORMAL LOW (ref 3.5–5.1)
SODIUM: 138 mmol/L (ref 135–145)

## 2016-03-16 LAB — CBC
HCT: 32.2 % — ABNORMAL LOW (ref 39.0–52.0)
Hemoglobin: 10.5 g/dL — ABNORMAL LOW (ref 13.0–17.0)
MCH: 29.9 pg (ref 26.0–34.0)
MCHC: 32.6 g/dL (ref 30.0–36.0)
MCV: 91.7 fL (ref 78.0–100.0)
PLATELETS: 254 10*3/uL (ref 150–400)
RBC: 3.51 MIL/uL — ABNORMAL LOW (ref 4.22–5.81)
RDW: 13.2 % (ref 11.5–15.5)
WBC: 7 10*3/uL (ref 4.0–10.5)

## 2016-03-16 LAB — URINE CULTURE

## 2016-03-16 MED ORDER — ONDANSETRON HCL 4 MG/2ML IJ SOLN: 4.0000 mg | Freq: Four times a day (QID) | INTRAMUSCULAR | Status: DC | PRN

## 2016-03-16 MED ORDER — OXYCODONE-ACETAMINOPHEN 5-325 MG PO TABS
1.0000 | ORAL_TABLET | Freq: Four times a day (QID) | ORAL | Status: DC | PRN
  Administered 2016-03-16 – 2016-03-18 (×4): 2 via ORAL
  Filled 2016-03-16 (×4): qty 2

## 2016-03-16 MED ORDER — BENAZEPRIL HCL 5 MG PO TABS
10.0000 mg | ORAL_TABLET | Freq: Every day | ORAL | Status: DC
  Administered 2016-03-17 – 2016-03-18 (×3): 10 mg via ORAL
  Filled 2016-03-16 (×3): qty 2

## 2016-03-16 MED ORDER — TIZANIDINE HCL 4 MG PO TABS
2.0000 mg | ORAL_TABLET | Freq: Three times a day (TID) | ORAL | Status: DC | PRN
  Filled 2016-03-16 (×2): qty 1

## 2016-03-16 MED ORDER — TAMSULOSIN HCL 0.4 MG PO CAPS
0.4000 mg | ORAL_CAPSULE | Freq: Every day | ORAL | Status: DC
  Administered 2016-03-16 – 2016-03-18 (×3): 0.4 mg via ORAL
  Filled 2016-03-16 (×3): qty 1

## 2016-03-16 MED ORDER — ENOXAPARIN SODIUM 40 MG/0.4ML ~~LOC~~ SOLN
40.0000 mg | SUBCUTANEOUS | Status: DC
  Administered 2016-03-16 – 2016-03-18 (×3): 40 mg via SUBCUTANEOUS
  Filled 2016-03-16 (×3): qty 0.4

## 2016-03-16 MED ORDER — MAGNESIUM HYDROXIDE 400 MG/5ML PO SUSP: 30.0000 mL | Freq: Every day | ORAL | Status: DC | PRN

## 2016-03-16 MED ORDER — DOCUSATE SODIUM 100 MG PO CAPS
100.0000 mg | ORAL_CAPSULE | Freq: Two times a day (BID) | ORAL | Status: DC
  Administered 2016-03-16 – 2016-03-18 (×4): 100 mg via ORAL
  Filled 2016-03-16 (×5): qty 1

## 2016-03-16 MED ORDER — AMLODIPINE BESY-BENAZEPRIL HCL 5-10 MG PO CAPS: 1.0000 | ORAL_CAPSULE | Freq: Every day | ORAL | Status: DC

## 2016-03-16 MED ORDER — SODIUM CHLORIDE 0.9 % IV SOLN
INTRAVENOUS | Status: DC
  Administered 2016-03-16: 01:00:00 via INTRAVENOUS

## 2016-03-16 MED ORDER — ACETAMINOPHEN 325 MG PO TABS
650.0000 mg | ORAL_TABLET | Freq: Four times a day (QID) | ORAL | Status: DC | PRN
  Administered 2016-03-18: 650 mg via ORAL
  Filled 2016-03-16 (×2): qty 2

## 2016-03-16 MED ORDER — ACETAMINOPHEN 650 MG RE SUPP: 650.0000 mg | Freq: Four times a day (QID) | RECTAL | Status: DC | PRN

## 2016-03-16 MED ORDER — ASPIRIN EC 325 MG PO TBEC
325.0000 mg | DELAYED_RELEASE_TABLET | Freq: Two times a day (BID) | ORAL | Status: DC
  Administered 2016-03-17 – 2016-03-18 (×4): 325 mg via ORAL
  Filled 2016-03-16 (×4): qty 1

## 2016-03-16 MED ORDER — ONDANSETRON HCL 4 MG PO TABS
4.0000 mg | ORAL_TABLET | Freq: Four times a day (QID) | ORAL | Status: DC | PRN
Start: 2016-03-16 — End: 2016-03-18

## 2016-03-16 MED ORDER — DEXTROSE 5 % IV SOLN
1.0000 g | INTRAVENOUS | Status: DC
  Administered 2016-03-16 – 2016-03-17 (×2): 1 g via INTRAVENOUS
  Filled 2016-03-16 (×3): qty 10

## 2016-03-16 MED ORDER — POTASSIUM CHLORIDE CRYS ER 20 MEQ PO TBCR
40.0000 meq | EXTENDED_RELEASE_TABLET | Freq: Once | ORAL | Status: AC
  Administered 2016-03-16: 40 meq via ORAL
  Filled 2016-03-16: qty 2

## 2016-03-16 MED ORDER — AMLODIPINE BESYLATE 5 MG PO TABS
5.0000 mg | ORAL_TABLET | Freq: Every day | ORAL | Status: DC
  Administered 2016-03-17 – 2016-03-18 (×3): 5 mg via ORAL
  Filled 2016-03-16 (×3): qty 1

## 2016-03-16 NOTE — Care Management Obs Status (Deleted)
[facility] NOTIFICATION   Patient Details  Name: Danny Cobb MRN: [id number] Date of Birth: 05/14/1945   Medicare Observation Status Notification Given:  Yes    Ninfa Meeker, RN 03/16/2016, 11:58 AM

## 2016-03-16 NOTE — Progress Notes (Signed)
Second attempt to insert  Urinary catheter and Pt. refused. Patient requested to try to use the urinal

## 2016-03-16 NOTE — Care Management Obs Status (Signed)
[facility] NOTIFICATION   Patient Details  Name: Danny Cobb MRN: [id number] Date of Birth: 02-15-1945   Medicare Observation Status Notification Given:  Yes    Ninfa Meeker, RN 03/16/2016, 11:56 AM

## 2016-03-16 NOTE — Progress Notes (Signed)
Patient ID: Danny Cobb, male   DOB: 07-Oct-1945, 71 y.o.   MRN: [id number]  PROGRESS NOTE    Danny Cobb  [id number] DOB: 1945/12/03 DOA: 03/15/2016  PCP: Kandice Hams, MD   Brief Narrative:   71 y.o. male with a past medical history significant for HTN and recent TKR who presented with malaise and suspected UTI. The patient was admitted 1/19-1/21 for left TKR by Dr. Berenice Primas.  He has BPH and hospitalization complicated by urinary retention.  He failed voiding trial again in Dr. Carlynn Purl office on 1/23 and then again on 1/30, and so his Flomax was doubled and the plan was to follow up on 2/12.   In the meantime, the patient has felt progressively weak and tired. He saw his PCP and had a low grade fever, tachycardia and was orthostatic. He was sent to ED for possible sepsis and UTI.  On admission, pt was hemodynamically stable. Blood work showed Cr of 1.15, WBC count 15, lactic acid 1.7. UA showed too numerous to count WBC and nitrites. He was started on rocephin while awaiting urine cx results.   Assessment & Plan:   Principal Problem:   UTI (urinary tract infection) / Leukocytosis  - Urine cx with multiple species, non predominant - Pt on empiric Rocephin - Had a low grade fever in past 24 hours: 100.1 F  Active Problems:   Hypokalemia - In the setting of UTI - Supplemented - Follow up BMP in am    Hypertension, essential - Continue Lotrel      BPH with urinary retention - Resume Flomax  - Foley in place    DVT prophylaxis: Lovenox subQ Code Status: full code  Family Communication: no family at the bedside  Disposition Plan: home possibly by 03/18/2016 unless he feels better by tomorrow   Consultants:   None   Procedures:   None   Antimicrobials:   Rocephin 03/15/2016 -->    Subjective: No overnight events.   Objective: Vitals:   03/15/16 2315 03/16/16 0030 03/16/16 0434 03/16/16 1500  BP: 120/65 139/75 127/74 130/75  Pulse: 89 88 72 88    Resp:  18 18 18   Temp:  99.8 F (37.7 C) 98.1 F (36.7 C) 100.1 F (37.8 C)  TempSrc:  Oral Oral Oral  SpO2: 95% 96% 96% 96%  Weight:      Height:        Intake/Output Summary (Last 24 hours) at 03/16/16 1824 Last data filed at 03/16/16 1700  Gross per 24 hour  Intake             2530 ml  Output             1600 ml  Net              930 ml   Filed Weights   03/15/16 2122  Weight: 98 kg (216 lb)    Examination:  General exam: Appears calm and comfortable  Respiratory system: Clear to auscultation. Respiratory effort normal. Cardiovascular system: S1 & S2 heard, Rate controlled  Gastrointestinal system: Abdomen is nondistended, soft and nontender. No organomegaly or masses felt. Normal bowel sounds heard. Has foley cathter in place  Central nervous system: Alert and oriented. No focal neurological deficits. Extremities: Symmetric 5 x 5 power. Skin: No rashes, lesions or ulcers Psychiatry: Judgement and insight appear normal. Mood & affect appropriate.   Data Reviewed: I have personally reviewed following labs and imaging studies  CBC:  Recent Labs Lab 03/15/16  1611 03/16/16 0858  WBC 15.1* 7.0  NEUTROABS 13.6*  --   HGB 12.1* 10.5*  HCT 36.4* 32.2*  MCV 91.9 91.7  PLT 312 0000000   Basic Metabolic Panel:  Recent Labs Lab 03/15/16 1611 03/16/16 0858  NA 136 138  K 3.6 3.4*  CL 103 109  CO2 23 25  GLUCOSE 157* 132*  BUN 19 15  CREATININE 1.15 1.07  CALCIUM 9.2 8.5*   GFR: Estimated Creatinine Clearance: 70.8 mL/min (by C-G formula based on SCr of 1.07 mg/dL). Liver Function Tests:  Recent Labs Lab 03/15/16 1611  AST 30  ALT 27  ALKPHOS 54  BILITOT 0.8  PROT 7.0  ALBUMIN 3.4*   No results for input(s): LIPASE, AMYLASE in the last 168 hours. No results for input(s): AMMONIA in the last 168 hours. Coagulation Profile:  Recent Labs Lab 03/15/16 1710  INR 1.21   Cardiac Enzymes: No results for input(s): CKTOTAL, CKMB, CKMBINDEX, TROPONINI  in the last 168 hours. BNP (last 3 results) No results for input(s): PROBNP in the last 8760 hours. HbA1C: No results for input(s): HGBA1C in the last 72 hours. CBG: No results for input(s): GLUCAP in the last 168 hours. Lipid Profile: No results for input(s): CHOL, HDL, LDLCALC, TRIG, CHOLHDL, LDLDIRECT in the last 72 hours. Thyroid Function Tests: No results for input(s): TSH, T4TOTAL, FREET4, T3FREE, THYROIDAB in the last 72 hours. Anemia Panel: No results for input(s): VITAMINB12, FOLATE, FERRITIN, TIBC, IRON, RETICCTPCT in the last 72 hours. Urine analysis:    Component Value Date/Time   COLORURINE AMBER (A) 03/15/2016 1613   APPEARANCEUR CLOUDY (A) 03/15/2016 1613   LABSPEC 1.027 03/15/2016 1613   PHURINE 5.0 03/15/2016 1613   GLUCOSEU NEGATIVE 03/15/2016 1613   HGBUR MODERATE (A) 03/15/2016 1613   BILIRUBINUR NEGATIVE 03/15/2016 1613   KETONESUR NEGATIVE 03/15/2016 1613   PROTEINUR 100 (A) 03/15/2016 1613   UROBILINOGEN 0.2 11/25/2008 0945   NITRITE POSITIVE (A) 03/15/2016 1613   LEUKOCYTESUR LARGE (A) 03/15/2016 1613   Sepsis Labs: @LABRCNTIP (procalcitonin:4,lacticidven:4)   Recent Results (from the past 240 hour(s))  Urine culture     Status: Abnormal   Collection Time: 03/15/16  4:13 PM  Result Value Ref Range Status   Specimen Description URINE, CATHETERIZED  Final   Special Requests NONE  Final   Culture MULTIPLE SPECIES PRESENT, SUGGEST RECOLLECTION (A)  Final   Report Status 03/16/2016 FINAL  Final      Radiology Studies: No results found.   Scheduled Meds: . cefTRIAXone (ROCEPHIN)  IV  1 g Intravenous Q24H  . docusate sodium  100 mg Oral BID  . enoxaparin (LOVENOX) injection  40 mg Subcutaneous Q24H  . tamsulosin  0.4 mg Oral Daily   Continuous Infusions: . sodium chloride 150 mL/hr at 03/16/16 0055     LOS: 0 days    Time spent: 25 minutes  Greater than 50% of the time spent on counseling and coordinating the care.   Leisa Lenz,  MD [facility] Pager [phone]  If 7PM-7AM, please contact night-coverage [email or web address] Password [email or web address] 03/16/2016, 6:24 PM

## 2016-03-16 NOTE — Progress Notes (Signed)
Urinary catheter taken out. Patient requested to try voiding and refused new catheter insertion at this time.

## 2016-03-17 DIAGNOSIS — E876 Hypokalemia: Secondary | ICD-10-CM | POA: Clinically undetermined

## 2016-03-17 DIAGNOSIS — R338 Other retention of urine: Secondary | ICD-10-CM

## 2016-03-17 DIAGNOSIS — I10 Essential (primary) hypertension: Secondary | ICD-10-CM | POA: Diagnosis not present

## 2016-03-17 DIAGNOSIS — E860 Dehydration: Secondary | ICD-10-CM

## 2016-03-17 DIAGNOSIS — N390 Urinary tract infection, site not specified: Secondary | ICD-10-CM | POA: Diagnosis not present

## 2016-03-17 LAB — BASIC METABOLIC PANEL
ANION GAP: 7 (ref 5–15)
BUN: 12 mg/dL (ref 6–20)
CHLORIDE: 107 mmol/L (ref 101–111)
CO2: 23 mmol/L (ref 22–32)
Calcium: 8.4 mg/dL — ABNORMAL LOW (ref 8.9–10.3)
Creatinine, Ser: 0.93 mg/dL (ref 0.61–1.24)
GFR calc Af Amer: >60 mL/min (ref >60)
GLUCOSE: 116 mg/dL — AB (ref 65–99)
POTASSIUM: 3.6 mmol/L (ref 3.5–5.1)
Sodium: 137 mmol/L (ref 135–145)

## 2016-03-17 LAB — CBC
HEMATOCRIT: 30.8 % — AB (ref 39.0–52.0)
HEMOGLOBIN: 10.3 g/dL — AB (ref 13.0–17.0)
MCH: 30.3 pg (ref 26.0–34.0)
MCHC: 33.4 g/dL (ref 30.0–36.0)
MCV: 90.6 fL (ref 78.0–100.0)
PLATELETS: 281 10*3/uL (ref 150–400)
RBC: 3.4 MIL/uL — AB (ref 4.22–5.81)
RDW: 13.2 % (ref 11.5–15.5)
WBC: 6.6 10*3/uL (ref 4.0–10.5)

## 2016-03-17 NOTE — Progress Notes (Signed)
PROGRESS NOTE    Danny Cobb  [id number] DOB: [birth year] DOA: 03/15/2016 PCP: Kandice Hams, MD    Brief Narrative:  71 y.o.malewith a past medical history significant for HTN and recent TKRwho presented with malaise and suspected UTI. The patient was admitted 1/19-1/21 for left TKR by Dr. Berenice Primas. He has BPH and hospitalization complicated by urinary retention. He failed voiding trial again in Dr. Carlynn Purl office on 1/23 and then again on 1/30, and so his Flomax was doubled and the plan was to follow up on 2/12.  In the meantime, the patient has felt progressively weak and tired. He saw his PCP and had a low grade fever, tachycardia and was orthostatic. He was sent to ED for possible sepsis and UTI.  On admission, pt was hemodynamically stable. Blood work showed Cr of 1.15, WBC count 15, lactic acid 1.7. UA showed too numerous to count WBC and nitrites. He was started on rocephin while awaiting urine cx results.   Assessment & Plan:   Principal Problem:   UTI (urinary tract infection) Active Problems:   Orthostasis   Acute urinary retention   Essential hypertension   Hypokalemia  UTI (urinary tract infection) / Leukocytosis  - Urine cx with multiple species, non predominant - Patient on admission had presented with a fever, leukocytosis with a white count of 15.1 and noted to be orthostatic on presentation. Urinalysis had large leukocytes and positive nitrites, cloudy,too numerous to count WBCs. - Patient placed on IV Rocephin. Patient noted to have low-grade fever in the past 24 hours of 100.1. - Continue IV Rocephin and if afebrile to transition to oral antibiotics tomorrow to complete a 7-10 day course of antibiotic treatment.   Orthostasis Likely secondary to acute infection of UTI. Patient with clinical improvement. Continue IV fluids. Repeat orthostatics in the morning.  Active Problems:   Hypokalemia - In the setting of UTI - Supplemented - Follow up BMP  in am    Hypertension, essential - Patient's Lotrel was resumed. Monitor closely.      BPH with urinary retention - Resumed Flomax  - Foley in place  -Outpatient follow-up with urology as scheduled.    DVT prophylaxis: Lovenox Code Status: Full Family Communication: Updated patient. No family at bedside. Disposition Plan: Home when afebrile for 24-48 hours, no longer orthostatic and with clinical improvement hopefully tomorrow 03/18/2016.   Consultants:   None  Procedures:   None  Antimicrobials:   IV Rocephin 03/15/2016   Subjective: Patient states weakness is improving since admission. Patient denies any chest pain. No shortness of breath.  Objective: Vitals:   03/16/16 1500 03/16/16 2045 03/17/16 0456 03/17/16 1406  BP: 130/75 133/72 134/77 120/63  Pulse: 88 78 71 76  Resp: 18 18 18 17   Temp: 100.1 F (37.8 C) 99.9 F (37.7 C) 99.6 F (37.6 C) 98.2 F (36.8 C)  TempSrc: Oral Oral Oral Oral  SpO2: 96% 98% 96% 98%  Weight:      Height:        Intake/Output Summary (Last 24 hours) at 03/17/16 1902 Last data filed at 03/17/16 1500  Gross per 24 hour  Intake           5212.5 ml  Output             2000 ml  Net           3212.5 ml   Filed Weights   03/15/16 2122  Weight: 98 kg (216 lb)    Examination:  General exam: Appears calm and comfortable  Respiratory system: Clear to auscultation. Respiratory effort normal. Cardiovascular system: S1 & S2 heard, RRR. No JVD, murmurs, rubs, gallops or clicks. No pedal edema. Gastrointestinal system: Abdomen is nondistended, soft and nontender. No organomegaly or masses felt. Normal bowel sounds heard. Central nervous system: Alert and oriented. No focal neurological deficits. Extremities: Symmetric 5 x 5 power. Skin: No rashes, lesions or ulcers Psychiatry: Judgement and insight appear normal. Mood & affect appropriate.     Data Reviewed: I have personally reviewed following labs and imaging  studies  CBC:  Recent Labs Lab 03/15/16 1611 03/16/16 0858 03/17/16 0316  WBC 15.1* 7.0 6.6  NEUTROABS 13.6*  --   --   HGB 12.1* 10.5* 10.3*  HCT 36.4* 32.2* 30.8*  MCV 91.9 91.7 90.6  PLT 312 254 [id number]   Basic Metabolic Panel:  Recent Labs Lab 03/15/16 1611 03/16/16 0858 03/17/16 0316  NA 136 138 137  K 3.6 3.4* 3.6  CL 103 109 107  CO2 23 25 23   GLUCOSE 157* 132* 116*  BUN 19 15 12   CREATININE 1.15 1.07 0.93  CALCIUM 9.2 8.5* 8.4*   GFR: Estimated Creatinine Clearance: 81.4 mL/min (by C-G formula based on SCr of 0.93 mg/dL). Liver Function Tests:  Recent Labs Lab 03/15/16 1611  AST 30  ALT 27  ALKPHOS 54  BILITOT 0.8  PROT 7.0  ALBUMIN 3.4*   No results for input(s): LIPASE, AMYLASE in the last 168 hours. No results for input(s): AMMONIA in the last 168 hours. Coagulation Profile:  Recent Labs Lab 03/15/16 1710  INR 1.21   Cardiac Enzymes: No results for input(s): CKTOTAL, CKMB, CKMBINDEX, TROPONINI in the last 168 hours. BNP (last 3 results) No results for input(s): PROBNP in the last 8760 hours. HbA1C: No results for input(s): HGBA1C in the last 72 hours. CBG: No results for input(s): GLUCAP in the last 168 hours. Lipid Profile: No results for input(s): CHOL, HDL, LDLCALC, TRIG, CHOLHDL, LDLDIRECT in the last 72 hours. Thyroid Function Tests: No results for input(s): TSH, T4TOTAL, FREET4, T3FREE, THYROIDAB in the last 72 hours. Anemia Panel: No results for input(s): VITAMINB12, FOLATE, FERRITIN, TIBC, IRON, RETICCTPCT in the last 72 hours. Sepsis Labs:  Recent Labs Lab 03/15/16 1648 03/15/16 1922  LATICACIDVEN 1.71 1.20    Recent Results (from the past 240 hour(s))  Urine culture     Status: Abnormal   Collection Time: 03/15/16  4:13 PM  Result Value Ref Range Status   Specimen Description URINE, CATHETERIZED  Final   Special Requests NONE  Final   Culture MULTIPLE SPECIES PRESENT, SUGGEST RECOLLECTION (A)  Final   Report Status  03/16/2016 FINAL  Final         Radiology Studies: No results found.      Scheduled Meds: . amLODipine  5 mg Oral Daily  . aspirin EC  325 mg Oral BID PC  . benazepril  10 mg Oral Daily  . cefTRIAXone (ROCEPHIN)  IV  1 g Intravenous Q24H  . docusate sodium  100 mg Oral BID  . enoxaparin (LOVENOX) injection  40 mg Subcutaneous Q24H  . tamsulosin  0.4 mg Oral Daily   Continuous Infusions: . sodium chloride 75 mL/hr at 03/17/16 0700     LOS: 0 days    Time spent: 76 minutes    THOMPSON,DANIEL, MD [facility] Pager [phone]  If 7PM-7AM, please contact night-coverage [email or web address] Password [email or web address] 03/17/2016, 7:02 PM

## 2016-03-18 DIAGNOSIS — N390 Urinary tract infection, site not specified: Secondary | ICD-10-CM | POA: Diagnosis not present

## 2016-03-18 DIAGNOSIS — I10 Essential (primary) hypertension: Secondary | ICD-10-CM | POA: Diagnosis not present

## 2016-03-18 DIAGNOSIS — E876 Hypokalemia: Secondary | ICD-10-CM | POA: Diagnosis not present

## 2016-03-18 LAB — CBC
HCT: 35.4 % — ABNORMAL LOW (ref 39.0–52.0)
Hemoglobin: 11.8 g/dL — ABNORMAL LOW (ref 13.0–17.0)
MCH: 29.9 pg (ref 26.0–34.0)
MCHC: 33.3 g/dL (ref 30.0–36.0)
MCV: 89.8 fL (ref 78.0–100.0)
PLATELETS: 322 10*3/uL (ref 150–400)
RBC: 3.94 MIL/uL — AB (ref 4.22–5.81)
RDW: 13.3 % (ref 11.5–15.5)
WBC: 8.2 10*3/uL (ref 4.0–10.5)

## 2016-03-18 LAB — BASIC METABOLIC PANEL
Anion gap: 7 (ref 5–15)
BUN: 8 mg/dL (ref 6–20)
CO2: 23 mmol/L (ref 22–32)
CREATININE: 0.91 mg/dL (ref 0.61–1.24)
Calcium: 9 mg/dL (ref 8.9–10.3)
Chloride: 108 mmol/L (ref 101–111)
Glucose, Bld: 122 mg/dL — ABNORMAL HIGH (ref 65–99)
POTASSIUM: 3.4 mmol/L — AB (ref 3.5–5.1)
SODIUM: 138 mmol/L (ref 135–145)

## 2016-03-18 MED ORDER — ACETAMINOPHEN 325 MG PO TABS: 650.0000 mg | ORAL_TABLET | Freq: Four times a day (QID) | ORAL | 0 refills | Status: DC | PRN

## 2016-03-18 MED ORDER — CIPROFLOXACIN HCL 500 MG PO TABS: 500.0000 mg | ORAL_TABLET | Freq: Two times a day (BID) | ORAL | 0 refills | Status: DC

## 2016-03-18 NOTE — Evaluation (Signed)
Physical Therapy Evaluation Patient Details Name: Danny Cobb MRN: [id number] DOB: November 13, 1945 Today's Date: 03/18/2016   History of Present Illness  71 y.o. male with a past medical history significant for HTN and recent TKA (03/01/16) who presented with malaise and suspected UTI.   Clinical Impression  Pt mobilizing well during PT session while using rw. Pt denies any dizziness throughout session and reports that he feels like he will do fine when he goes home. Pt does have deficits with LT knee ROM and strength related to recent TKA, recommending HHPT services.  BP: sitting      124/64        Standing 124/68     Follow Up Recommendations Home health PT    Equipment Recommendations  None recommended by PT    Recommendations for Other Services       Precautions / Restrictions Precautions Precautions: Knee Precaution Comments: reviewed knee extension precautions Restrictions Weight Bearing Restrictions: Yes LLE Weight Bearing: Weight bearing as tolerated      Mobility  Bed Mobility               General bed mobility comments: pt sitting EOB upon arrival  Transfers Overall transfer level: Modified independent Equipment used: Rolling walker (2 wheeled) Transfers: Sit to/from Stand Sit to Stand: Modified independent (Device/Increase time)         General transfer comment: good technique and safety using rw  Ambulation/Gait Ambulation/Gait assistance: Modified independent (Device/Increase time) Ambulation Distance (Feet): 200 Feet Assistive device: Rolling walker (2 wheeled) Gait Pattern/deviations: Step-through pattern;Decreased stance time - left Gait velocity: decreased   General Gait Details: good stability, no loss of balance  Stairs         General stair comments: pt declined attempting stairs, reports feeling confident with performing at home.  Wheelchair Mobility    Modified Rankin (Stroke Patients Only)       Balance Overall balance  assessment: Needs assistance Sitting-balance support: No upper extremity supported Sitting balance-Leahy Scale: Normal     Standing balance support: During functional activity Standing balance-Leahy Scale: Fair Standing balance comment: using rw during ambulation                             Pertinent Vitals/Pain Pain Assessment: No/denies pain    Home Living Family/patient expects to be discharged to:: Private residence Living Arrangements: Alone Available Help at Discharge: Friend(s);Available PRN/intermittently;Home health;Available 24 hours/day Type of Home: House Home Access: Stairs to enter Entrance Stairs-Rails: None Entrance Stairs-Number of Steps: 2 Home Layout: Two level;Able to live on main level with bedroom/bathroom Home Equipment: Gilford Rile - 2 wheels      Prior Function Level of Independence: Independent with assistive device(s)         Comments: using rw for ambulation     Hand Dominance        Extremity/Trunk Assessment   Upper Extremity Assessment Upper Extremity Assessment: Overall WFL for tasks assessed    Lower Extremity Assessment Lower Extremity Assessment: LLE deficits/detail LLE Deficits / Details: good quad activation, unable to perform full range LAQ    Cervical / Trunk Assessment Cervical / Trunk Assessment: Normal  Communication   Communication: No difficulties  Cognition Arousal/Alertness: Awake/alert Behavior During Therapy: WFL for tasks assessed/performed Overall Cognitive Status: Within Functional Limits for tasks assessed                      General Comments General comments (  skin integrity, edema, etc.): BP; sitting 124/64, standing 124/68    Exercises Total Joint Exercises Quad Sets: Strengthening;Left;10 reps Heel Slides: AAROM;Left;10 reps Long Arc Quad: Strengthening;Left;10 reps Knee Flexion: AAROM;Left;10 reps Goniometric ROM: approx. 90 degrees knee flexion LT   Assessment/Plan    PT  Assessment Patient needs continued PT services  PT Problem List Decreased strength;Decreased range of motion;Decreased activity tolerance;Decreased mobility          PT Treatment Interventions DME instruction;Gait training;Stair training;Functional mobility training;Therapeutic exercise;Therapeutic activities;Balance training;Patient/family education    PT Goals (Current goals can be found in the Care Plan section)  Acute Rehab PT Goals Patient Stated Goal: get home and get knee working PT Goal Formulation: With patient Time For Goal Achievement: 04/01/16 Potential to Achieve Goals: Good    Frequency 7X/week   Barriers to discharge        Co-evaluation               End of Session   Activity Tolerance: Patient tolerated treatment well Patient left: with call bell/phone within reach;in chair Nurse Communication: Mobility status    Functional Assessment Tool Used: clinical judgment Functional Limitation: Mobility: Walking and moving around Mobility: Walking and Moving Around Current Status [id number]): At least 1 percent but less than 20 percent impaired, limited or restricted Mobility: Walking and Moving Around Goal Status [id number]): At least 1 percent but less than 20 percent impaired, limited or restricted    Time: 1009-1054 PT Time Calculation (min) (ACUTE ONLY): 45 min   Charges:   PT Evaluation $PT Eval Moderate Complexity: 1 Procedure PT Treatments $Gait Training: 8-22 mins $Therapeutic Exercise: 8-22 mins   PT G Codes:   PT G-Codes **NOT FOR INPATIENT CLASS** Functional Assessment Tool Used: clinical judgment Functional Limitation: Mobility: Walking and moving around Mobility: Walking and Moving Around Current Status [id number]): At least 1 percent but less than 20 percent impaired, limited or restricted Mobility: Walking and Moving Around Goal Status [phone]): At least 1 percent but less than 20 percent impaired, limited or restricted    Cassell Clement, PT,  [place] Pager [phone] Office [phone]  03/18/2016, 11:05 AM

## 2016-03-18 NOTE — Progress Notes (Signed)
Discharge instructions and discharge rx reviewed with pt. Pt alert and oriented. Acknowledges that he understands full instructions. Reminded to follow up with urology as well as ortho. No s/sx of distress. All belongings with pt

## 2016-03-18 NOTE — Discharge Summary (Signed)
Physician Discharge Summary  Gilles Hustead Weill [id number] DOB: 1945/10/11 DOA: 03/15/2016  PCP: Kandice Hams, MD  Admit date: 03/15/2016 Discharge date: 03/18/2016  Recommendations for Outpatient Follow-up:  Follow up with urology per scheduled appointment. Continue foley catheter until you are seen in urology office. Continue cipro for 7 days on discharge for urinary tract infection.  Discharge Diagnoses:  Principal Problem:   UTI (urinary tract infection) Active Problems:   Acute urinary retention   Orthostasis   Essential hypertension   Hypokalemia   Dehydration    Discharge Condition: stable   Diet recommendation: as tolerated   History of present illness:  71 y.o.malewith a past medical history significant for HTN and recent TKRwho presentedwith malaise and suspected UTI. The patient was admitted 1/19-1/21 for leftTKR by Dr. Berenice Primas. He has BPH and hospitalization complicated by urinary retention. He failed voiding trial again in Dr. Carlynn Purl office on 1/23 and then again on 1/30, and so his Flomax was doubled and the plan was to follow up on 2/12.  In the meantime, the patient has felt progressively weak and tired. He saw his PCP and had a low grade fever, tachycardia and was orthostatic. He was sent to ED for possible sepsis and UTI.  On admission, pt was hemodynamically stable. Blood work showed Cr of 1.15, WBC count 15, lactic acid 1.7. UA showed too numerous to count WBC and nitrites. He was started on rocephin while awaiting urine cx results.  Hospital Course:   UTI (urinary tract infection) / Leukocytosis  - Urine cx with multiple species, non predominant - Patient on admission had presented with a fever, leukocytosis with a white count of 15.1 and noted to be orthostatic on presentation. Urinalysis had large leukocytes and positive nitrites, cloudy,too numerous to count WBCs. - Patient placed on IV Rocephin.  - Empiric cipro for 7 days on discharge -  Foley to remain in place until pt follows up with GU   Orthostasis - Likely secondary to acute infection of UTI. Patient with clinical improvement. - PT eval prior to discharge   Active Problems: Hypokalemia - In the setting of UTI - Supplemented  Hypertension, essential - Continue home meds  BPH with urinary retention - Resumed Flomax  - Foley in place  - Outpatient follow-up with urology as scheduled.    DVT prophylaxis: Lovenox Code Status: Full Family Communication: Updated patient. No family at bedside.    Consultants:   None  Procedures:   None  Antimicrobials:   IV Rocephin 03/15/2016  Signed:  Leisa Lenz, MD  [facility] 03/18/2016, 10:52 AM  Pager #: [phone]  Time spent in minutes: less than 30 minutes   Discharge Exam: Vitals:   03/17/16 1406 03/17/16 2124  BP: 120/63 137/81  Pulse: 76 64  Resp: 17 16  Temp: 98.2 F (36.8 C) 97.6 F (36.4 C)   Vitals:   03/16/16 2045 03/17/16 0456 03/17/16 1406 03/17/16 2124  BP: 133/72 134/77 120/63 137/81  Pulse: 78 71 76 64  Resp: 18 18 17 16   Temp: 99.9 F (37.7 C) 99.6 F (37.6 C) 98.2 F (36.8 C) 97.6 F (36.4 C)  TempSrc: Oral Oral Oral Oral  SpO2: 98% 96% 98% 96%  Weight:      Height:        General: Pt is alert, follows commands appropriately, not in acute distress Cardiovascular: Regular rate and rhythm, S1/S2 + Respiratory: Clear to auscultation bilaterally, no wheezing, no crackles, no rhonchi Abdominal: Soft, non tender, non distended,  bowel sounds +, no guarding Extremities: no edema, no cyanosis, pulses palpable bilaterally DP and PT Neuro: Grossly nonfocal  Discharge Instructions  Discharge Instructions    Call MD for:  persistant nausea and vomiting    Complete by:  As directed    Call MD for:  redness, tenderness, or signs of infection (pain, swelling, redness, odor or green/yellow discharge around incision site)    Complete by:  As  directed    Call MD for:  severe uncontrolled pain    Complete by:  As directed    Diet - low sodium heart healthy    Complete by:  As directed    Discharge instructions    Complete by:  As directed    Follow up with urology per scheduled appointment. Continue foley catheter until you are seen in urology office. Continue cipro for 7 days on discharge for urinary tract infection.   Increase activity slowly    Complete by:  As directed      Allergies as of 03/18/2016   No Known Allergies     Medication List    TAKE these medications   acetaminophen 325 MG tablet Commonly known as:  TYLENOL Take 2 tablets (650 mg total) by mouth every 6 (six) hours as needed for mild pain (or Fever >/= 101).   amLODipine-benazepril 5-10 MG capsule Commonly known as:  LOTREL Take 1 capsule by mouth daily.   aspirin EC 325 MG tablet Take 1 tablet (325 mg total) by mouth 2 (two) times daily after a meal. Take x 1 month post op to decrease risk of blood clots.   ciprofloxacin 500 MG tablet Commonly known as:  CIPRO Take 1 tablet (500 mg total) by mouth 2 (two) times daily.   docusate sodium 100 MG capsule Commonly known as:  COLACE Take 1 capsule (100 mg total) by mouth 2 (two) times daily.   hydrocortisone cream 1 % Apply 1 application topically daily as needed (rash).   oxyCODONE-acetaminophen 5-325 MG tablet Commonly known as:  PERCOCET/ROXICET Take 1-2 tablets by mouth every 6 (six) hours as needed for severe pain.   tamsulosin 0.4 MG Caps capsule Commonly known as:  FLOMAX Take 1 capsule (0.4 mg total) by mouth daily.   tiZANidine 2 MG tablet Commonly known as:  ZANAFLEX Take 1 tablet (2 mg total) by mouth every 8 (eight) hours as needed for muscle spasms.      Follow-up Information    POLITE,RONALD D, MD. Schedule an appointment as soon as possible for a visit in 2 week(s).   Specialty:  Internal Medicine Contact information: [address] [place]  [address] [phone]            The results of significant diagnostics from this hospitalization (including imaging, microbiology, ancillary and laboratory) are listed below for reference.    Significant Diagnostic Studies: Dg Chest 2 View  Result Date: 02/22/2016 CLINICAL DATA:  Preoperative knee arthroplasty EXAM: CHEST  2 VIEW COMPARISON:  None. FINDINGS: Lungs are clear. Heart size and pulmonary vascularity are normal. No adenopathy. There is atherosclerotic calcification in the aorta. There is mild degenerative change in the thoracic spine. IMPRESSION: No edema or consolidation.  Aortic atherosclerosis. Electronically Signed   By: Lowella Grip III M.D.   On: 02/22/2016 10:37    Microbiology: Recent Results (from the past 240 hour(s))  Urine culture     Status: Abnormal   Collection Time: 03/15/16  4:13 PM  Result Value Ref Range  Status   Specimen Description URINE, CATHETERIZED  Final   Special Requests NONE  Final   Culture MULTIPLE SPECIES PRESENT, SUGGEST RECOLLECTION (A)  Final   Report Status 03/16/2016 FINAL  Final     Labs: Basic Metabolic Panel:  Recent Labs Lab 03/15/16 1611 03/16/16 0858 03/17/16 0316 03/18/16 0426  NA 136 138 137 138  K 3.6 3.4* 3.6 3.4*  CL 103 109 107 108  CO2 23 25 23 23   GLUCOSE 157* 132* 116* 122*  BUN 19 15 12 8   CREATININE 1.15 1.07 0.93 0.91  CALCIUM 9.2 8.5* 8.4* 9.0   Liver Function Tests:  Recent Labs Lab 03/15/16 1611  AST 30  ALT 27  ALKPHOS 54  BILITOT 0.8  PROT 7.0  ALBUMIN 3.4*   No results for input(s): LIPASE, AMYLASE in the last 168 hours. No results for input(s): AMMONIA in the last 168 hours. CBC:  Recent Labs Lab 03/15/16 1611 03/16/16 0858 03/17/16 0316 03/18/16 0426  WBC 15.1* 7.0 6.6 8.2  NEUTROABS 13.6*  --   --   --   HGB 12.1* 10.5* 10.3* 11.8*  HCT 36.4* 32.2* 30.8* 35.4*  MCV 91.9 91.7 90.6 89.8  PLT 312 254 281 322   Cardiac Enzymes: No results for input(s): CKTOTAL, CKMB,  CKMBINDEX, TROPONINI in the last 168 hours. BNP: BNP (last 3 results) No results for input(s): BNP in the last 8760 hours.  ProBNP (last 3 results) No results for input(s): PROBNP in the last 8760 hours.  CBG: No results for input(s): GLUCAP in the last 168 hours.

## 2016-03-18 NOTE — Discharge Instructions (Signed)
Urinary Tract Infection, Adult Introduction A urinary tract infection (UTI) is an infection of any part of the urinary tract. The urinary tract includes the:  Kidneys.  Ureters.  Bladder.  Urethra. These organs make, store, and get rid of pee (urine) in the body. Follow these instructions at home:  Take over-the-counter and prescription medicines only as told by your doctor.  If you were prescribed an antibiotic medicine, take it as told by your doctor. Do not stop taking the antibiotic even if you start to feel better.  Avoid the following drinks:  Alcohol.  Caffeine.  Tea.  Carbonated drinks.  Drink enough fluid to keep your pee clear or pale yellow.  Keep all follow-up visits as told by your doctor. This is important.  Make sure to:  Empty your bladder often and completely. Do not to hold pee for long periods of time.  Empty your bladder before and after sex.  Wipe from front to back after a bowel movement if you are male. Use each tissue one time when you wipe. Contact a doctor if:  You have back pain.  You have a fever.  You feel sick to your stomach (nauseous).  You throw up (vomit).  Your symptoms do not get better after 3 days.  Your symptoms go away and then come back. Get help right away if:  You have very bad back pain.  You have very bad lower belly (abdominal) pain.  You are throwing up and cannot keep down any medicines or water. This information is not intended to replace advice given to you by your health care provider. Make sure you discuss any questions you have with your health care provider. Document Released: 07/17/2007 Document Revised: 07/06/2015 Document Reviewed: 12/19/2014  2017 [employer]vier  

## 2016-04-24 ENCOUNTER — Encounter (HOSPITAL_COMMUNITY): Payer: Self-pay | Admitting: *Deleted

## 2016-04-24 ENCOUNTER — Emergency Department (HOSPITAL_COMMUNITY)
Admission: EM | Admit: 2016-04-24 | Discharge: 2016-04-25 | Disposition: A | Discharge status: Home / Self Care | Payer: Medicare Other | Attending: Emergency Medicine | Admitting: Emergency Medicine

## 2016-04-24 DIAGNOSIS — I10 Essential (primary) hypertension: Secondary | ICD-10-CM | POA: Insufficient documentation

## 2016-04-24 DIAGNOSIS — Z96652 Presence of left artificial knee joint: Secondary | ICD-10-CM | POA: Insufficient documentation

## 2016-04-24 DIAGNOSIS — R339 Retention of urine, unspecified: Secondary | ICD-10-CM

## 2016-04-24 DIAGNOSIS — Z7982 Long term (current) use of aspirin: Secondary | ICD-10-CM | POA: Diagnosis not present

## 2016-04-24 LAB — URINALYSIS, ROUTINE W REFLEX MICROSCOPIC
BACTERIA UA: NONE SEEN
BILIRUBIN URINE: NEGATIVE
GLUCOSE, UA: NEGATIVE mg/dL
KETONES UR: NEGATIVE mg/dL
NITRITE: NEGATIVE
PH: 7 (ref 5.0–8.0)
PROTEIN: NEGATIVE mg/dL
SQUAMOUS EPITHELIAL / LPF: NONE SEEN
Specific Gravity, Urine: 1.005 (ref 1.005–1.030)

## 2016-04-24 NOTE — ED Notes (Signed)
Spoke with PA in [place]

## 2016-04-24 NOTE — ED Notes (Signed)
See EDP assessment 

## 2016-04-24 NOTE — ED Notes (Signed)
Pt bladder scanned. Results 243mls.

## 2016-04-24 NOTE — ED Triage Notes (Signed)
Pt had bladder cath removed by urologist this am and has not been able to void since 4pm.  Pt feels bladder distention and urge and has only been able to void minimal amounts.

## 2016-04-24 NOTE — ED Notes (Signed)
Post-void bladder scanned, 543mls.

## 2016-04-24 NOTE — ED Provider Notes (Signed)
[facility] DEPT Provider Note    By signing my name below, I, [name], attest that this documentation has been prepared under the direction and in the presence of Harlene Ramus, PA-C. Electronically Signed: [name], ED Scribe. 04/24/16. 11:00 PM.   History   Chief Complaint Chief Complaint  Patient presents with  . Urinary Retention   The history is provided by the patient and medical records. No language interpreter was used.    Danny Cobb is a 71 y.o. male who presents to the Emergency Department complaining of urinary retention that began earlier today after having his catheter removed by his doctor at [facility]. He reports an associated feeling of fullness in his bladder. He last urinated normally 7 hours ago but was able to produce a small amount approximately 30 minutes ago. He has not taken anything for pain relief. There are no modifying factors noted. He denies penile pain or swelling, rectal pain, rashes or genital lesions, dysuria, hematuria, abdominal pain, nausea, vomiting, fever, chills. He states he just finished a course of antibiotics a few days ago after being admitted to the hospital in s/p knee replacement with reported UTI.    Past Medical History:  Diagnosis Date  . Anxiety   . Arthritis   . Cataract    right eye  . Colon polyps   . Elevated PSA   . History of kidney stones    over 10 yrs ago  . Hypertension 12-20-10   tx. Lotrel  . Hypertrophy of prostate without urinary obstruction and other lower urinary tract symptoms (LUTS)   . Lipoma of neck October 2012   posterior neck lesion   . Other malaise and fatigue   . Tubular adenoma 09/2010    Patient Active Problem List   Diagnosis Date Noted  . Hypokalemia 03/17/2016  . Dehydration   . UTI (urinary tract infection) 03/15/2016  . Orthostasis 03/15/2016  . Essential hypertension 03/15/2016  . Acute urinary retention 03/08/2016  . Primary osteoarthritis of left knee  03/01/2016  . Sebaceous cyst - midback - 3 cm 12/13/2010  . Lipoma of scalp - 4 cm 12/13/2010    Past Surgical History:  Procedure Laterality Date  . KNEE ARTHROCENTESIS  1989   left knee -scope  . MASS EXCISION  12/24/2010   Procedure: EXCISION MASS;  Surgeon: Imogene Burn. Georgette Dover, MD;  Location: [facility];  Service: General;  Laterality: N/A;  Excision of Sebaceous Masses  . PROSTATE SURGERY  2010   prostate biopsy-last done   . TONSILLECTOMY    . 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;  Service: Orthopedics;  Laterality: Left;       Home Medications    Prior to Admission medications   Medication Sig Start Date End Date Taking? Authorizing Provider  acetaminophen (TYLENOL) 325 MG tablet Take 2 tablets (650 mg total) by mouth every 6 (six) hours as needed for mild pain (or Fever >/= 101). 03/18/16   Robbie Lis, MD  amLODipine-benazepril (LOTREL) 5-10 MG per capsule Take 1 capsule by mouth daily.     Historical Provider, MD  aspirin EC 325 MG tablet Take 1 tablet (325 mg total) by mouth 2 (two) times daily after a meal. Take x 1 month post op to decrease risk of blood clots. 03/01/16   Gary Fleet, PA-C  ciprofloxacin (CIPRO) 500 MG tablet Take 1 tablet (500 mg total) by mouth 2 (  two) times daily. 03/18/16   Robbie Lis, MD  docusate sodium (COLACE) 100 MG capsule Take 1 capsule (100 mg total) by mouth 2 (two) times daily. 03/01/16   Gary Fleet, PA-C  hydrocortisone cream 1 % Apply 1 application topically daily as needed (rash).    Historical Provider, MD  oxyCODONE-acetaminophen (PERCOCET/ROXICET) 5-325 MG tablet Take 1-2 tablets by mouth every 6 (six) hours as needed for severe pain. 03/01/16   Gary Fleet, PA-C  tamsulosin (FLOMAX) 0.4 MG CAPS capsule Take 1 capsule (0.4 mg total) by mouth daily. 03/04/16   Milly Jakob, MD  tiZANidine (ZANAFLEX) 2 MG tablet Take 1 tablet (2 mg total) by mouth every 8  (eight) hours as needed for muscle spasms. 03/01/16   Gary Fleet, PA-C    Family History Family History  Problem Relation Age of Onset  . Cancer Mother     Social History Social History  Substance Use Topics  . Smoking status: Never Smoker  . Smokeless tobacco: Never Used  . Alcohol use Yes     Comment: 1 glass per week     Allergies   Patient has no known allergies.   Review of Systems Review of Systems  Constitutional: Negative for chills and fever.  Gastrointestinal: Negative for abdominal pain, nausea and vomiting.  Genitourinary: Positive for difficulty urinating. Negative for discharge, dysuria, hematuria, penile pain, penile swelling, scrotal swelling and testicular pain.     Physical Exam Updated Vital Signs BP 159/95 (BP Location: Right Arm)   Pulse 65   Temp 97.9 F (36.6 C) (Oral)   Resp 16   Wt 216 lb (98 kg)   SpO2 97%   BMI 32.84 kg/m   Physical Exam  Constitutional: He is oriented to person, place, and time. He appears well-developed and well-nourished.  HENT:  Head: Normocephalic and atraumatic.  Eyes: Conjunctivae and EOM are normal. Right eye exhibits no discharge. Left eye exhibits no discharge. No scleral icterus.  Neck: Normal range of motion. Neck supple.  Cardiovascular: Normal rate, regular rhythm, normal heart sounds and intact distal pulses.  Exam reveals no gallop and no friction rub.   No murmur heard. Pulmonary/Chest: Effort normal and breath sounds normal. No respiratory distress. He has no wheezes. He has no rales. He exhibits no tenderness.  Abdominal: Soft. Bowel sounds are normal. He exhibits no distension and no mass. There is no tenderness. There is no rebound and no guarding. No hernia.  Genitourinary: Penis normal.  Musculoskeletal: Normal range of motion. He exhibits no edema.  Neurological: He is alert and oriented to person, place, and time.  Skin: Skin is warm and dry.  Nursing note and vitals reviewed.    ED  Treatments / Results  DIAGNOSTIC STUDIES: Oxygen Saturation is 97% on RA, normal by my interpretation.   COORDINATION OF CARE: 10:59 PM- Will order urinalysis and ultrasound. Pt verbalizes understanding and agrees to plan.  Medications - No data to display  Labs (all labs ordered are listed, but only abnormal results are displayed) Labs Reviewed  URINALYSIS, ROUTINE W REFLEX MICROSCOPIC - Abnormal; Notable for the following:       Result Value   Color, Urine STRAW (*)    APPearance HAZY (*)    Hgb urine dipstick SMALL (*)    Leukocytes, UA LARGE (*)    All other components within normal limits  URINE CULTURE    EKG  EKG Interpretation None       Radiology No results found.  Procedures  Procedures (including critical care time)  Medications Ordered in ED Medications - No data to display   Initial Impression / Assessment and Plan / ED Course  I have reviewed the triage vital signs and the nursing notes.  Pertinent labs & imaging results that were available during my care of the patient were reviewed by me and considered in my medical decision making (see chart for details).     Patient presents with urinary retention after having his Foley catheter removed that had been in place for over a month earlier today at [facility]. Reports he has not been able to urinate since 4 PM. Endorses associated distention to lower abdomen. Denies fever, abdominal pain, vomiting. Chart review shows patient was admitted to the hospital on 03/15/16 for UTI and was discharged home on 2/5 on Cipro with Foley catheter and outpatient urology follow-up. VSS. Exam unremarkable. No abdominal tenderness. Patient only able to urinate small amount in the ED. Post void residual 571mL. Foley catheter placed in the ED. UA positive for small hemoglobin, large leukocytes, TNTC WBCs, no bacteria; do not suspect UTI however will send urine culture to confirm. Advised patient to call his urology clinic  tomorrow to schedule a follow-up appointment for further management of his Foley catheter/urinary retention. Discussed results and plan for discharge with patient. Discussed return precautions.  I personally performed the services described in this documentation, which was scribed in my presence. The recorded information has been reviewed and is accurate.   Final Clinical Impressions(s) / ED Diagnoses   Final diagnoses:  Urinary retention    New Prescriptions New Prescriptions   No medications on file     Nona Dell, PA-C 04/25/16 0026    Milton Ferguson, MD 04/26/16 [phone]

## 2016-04-25 NOTE — Discharge Instructions (Signed)
Call your urology office tomorrow to schedule a follow-up appointment for this week for further management and evaluation of your urinary retention and Foley catheter. Return to the emergency department if symptoms worsen or new onset of fever, abdominal pain, vomiting, unable to urinate, blood in urine, pain with urination.

## 2016-04-26 LAB — URINE CULTURE: Culture: <10000 — AB

## 2016-07-12 NOTE — Addendum Note (Signed)
Addendum  created 07/12/16 0909 by Effie Berkshire, MD   Sign clinical note

## 2018-06-03 IMAGING — CR DG CHEST 2V
2 series · 2 of 2 positions shown · non-contrast
Comparison: None.

CLINICAL DATA: Preoperative knee arthroplasty

EXAM:
CHEST  2 VIEW

[w chest pa]
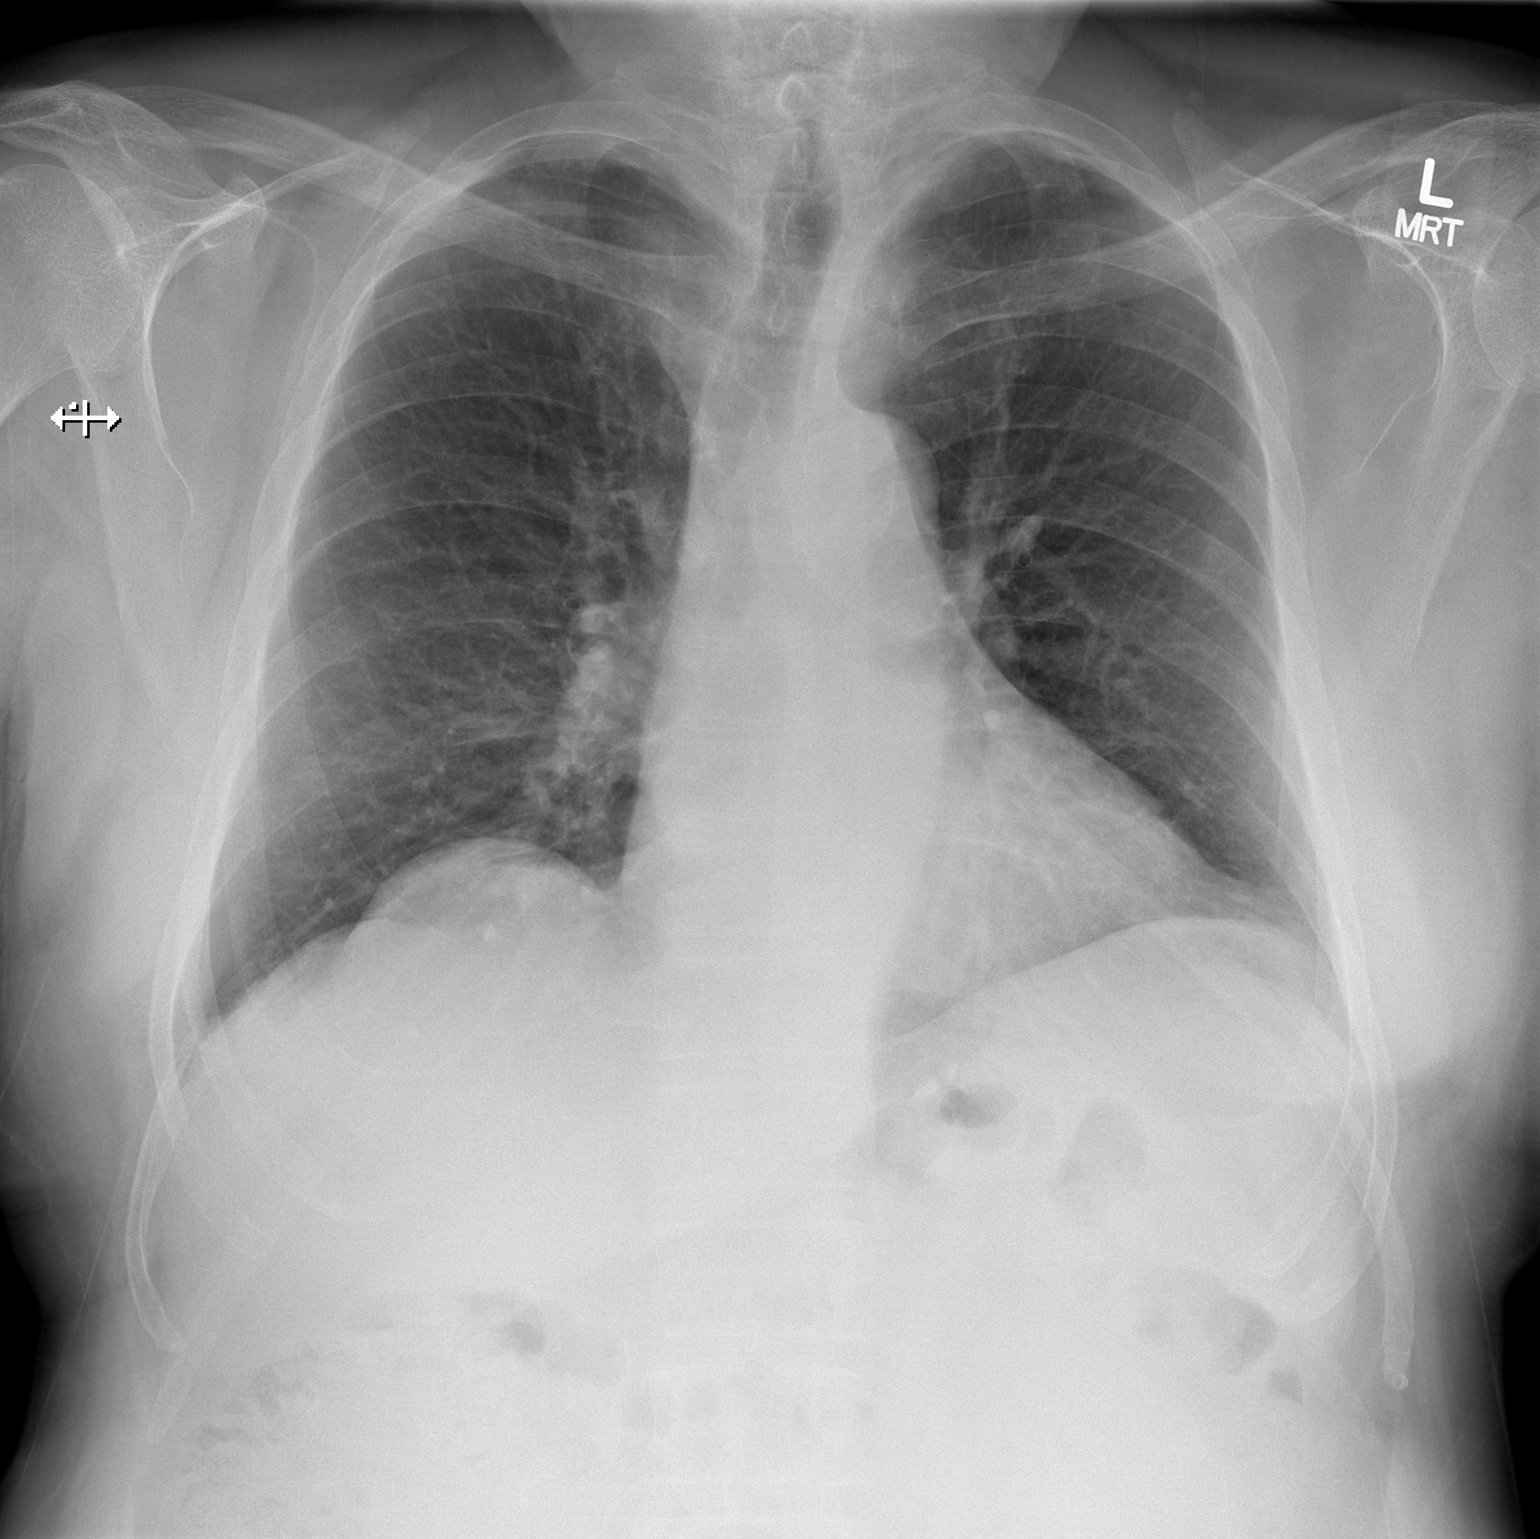

[w chest lat]
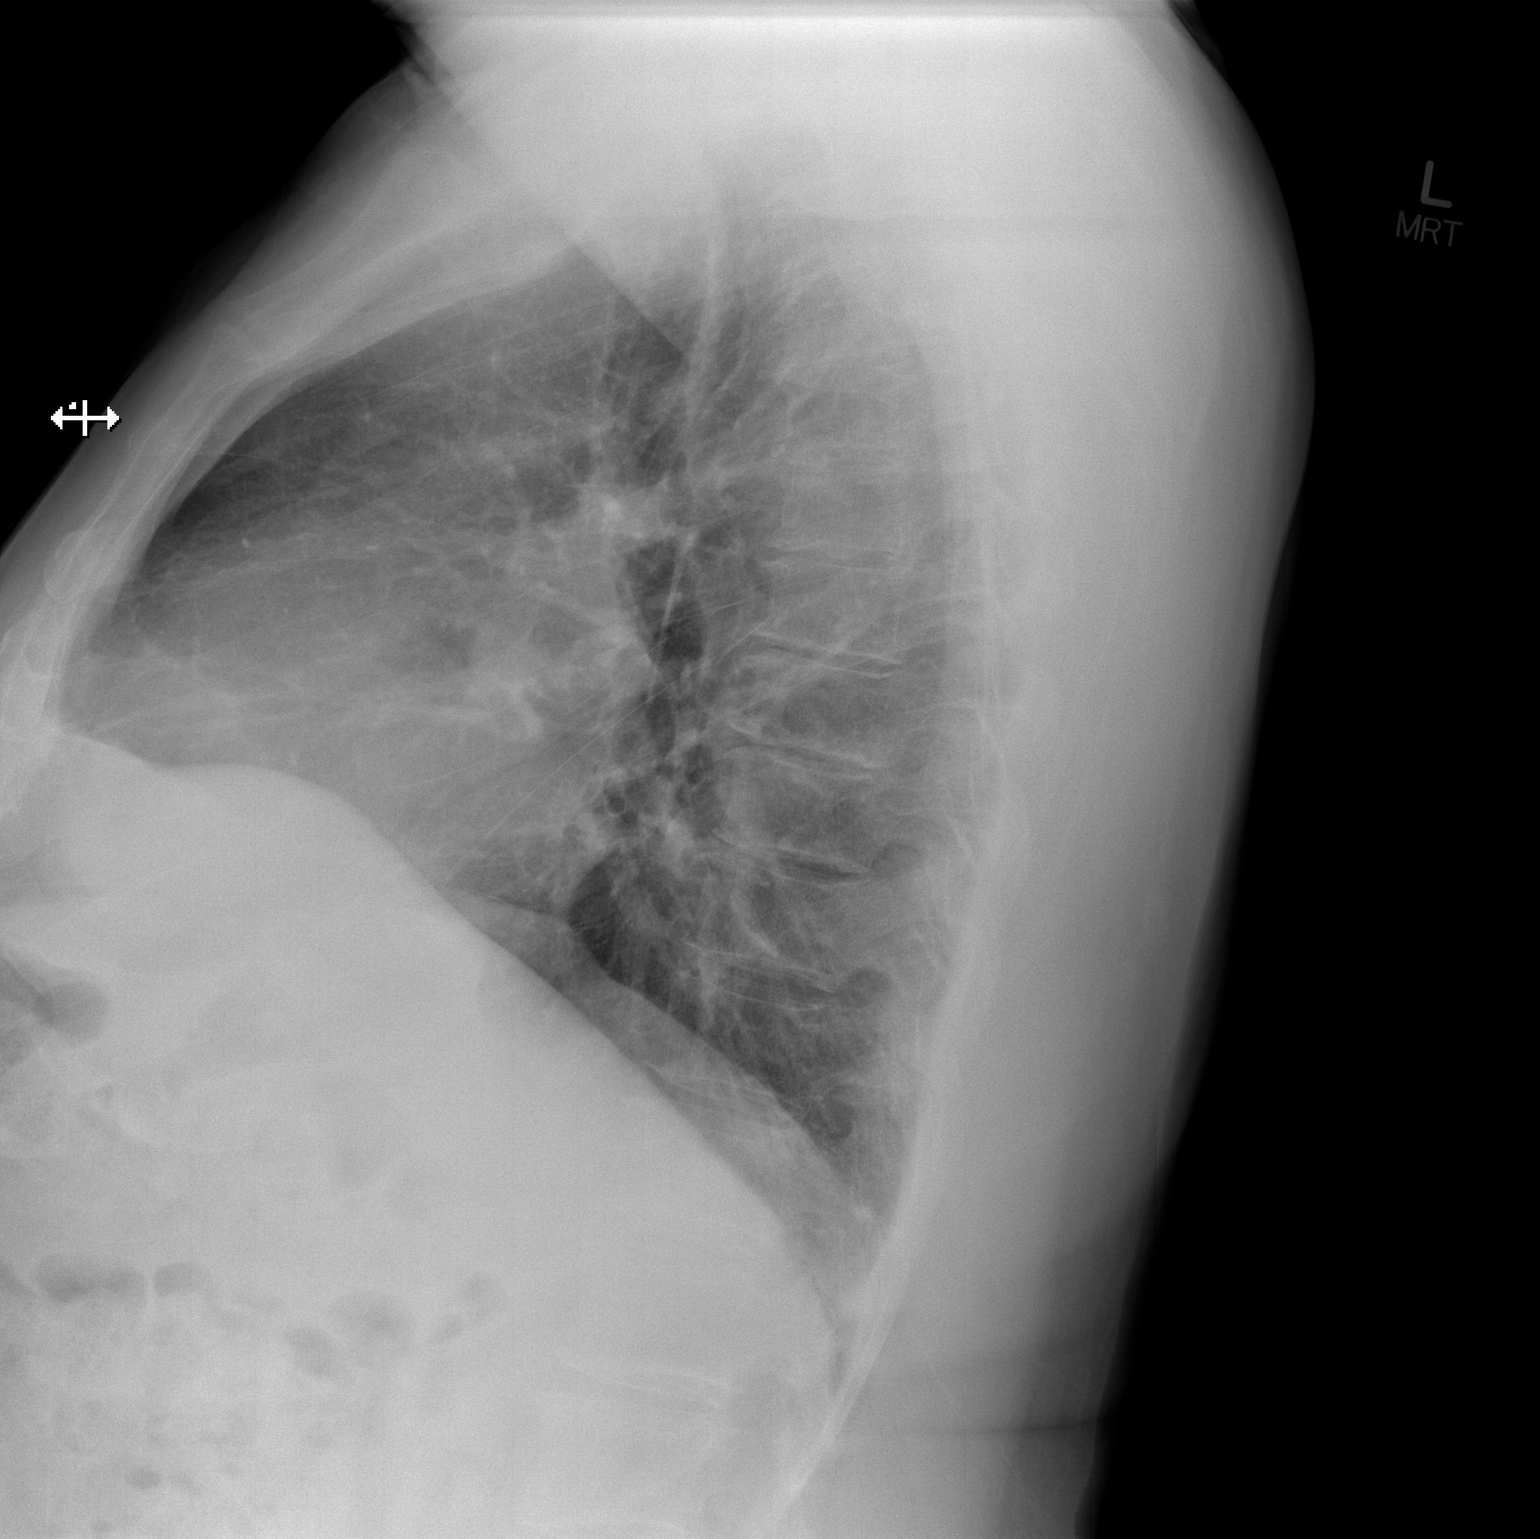

[2 of 2 positions shown; findings below may reference images not displayed]

FINDINGS: Lungs are clear. Heart size and pulmonary vascularity are normal. No
adenopathy. There is atherosclerotic calcification in the aorta.
There is mild degenerative change in the thoracic spine.
IMPRESSION: No edema or consolidation.  Aortic atherosclerosis.

## 2019-06-29 ENCOUNTER — Other Ambulatory Visit: Payer: Self-pay | Admitting: Gastroenterology

## 2019-08-03 ENCOUNTER — Other Ambulatory Visit (HOSPITAL_COMMUNITY): Payer: Medicare Other

## 2019-08-05 ENCOUNTER — Other Ambulatory Visit (HOSPITAL_COMMUNITY)
Admission: RE | Admit: 2019-08-05 | Discharge: 2019-08-05 | Disposition: A | Discharge status: Home / Self Care | Payer: Medicare Other | Source: Ambulatory Visit | Attending: Gastroenterology | Admitting: Gastroenterology

## 2019-08-05 DIAGNOSIS — Z20822 Contact with and (suspected) exposure to covid-19: Secondary | ICD-10-CM | POA: Diagnosis not present

## 2019-08-05 DIAGNOSIS — Z01812 Encounter for preprocedural laboratory examination: Secondary | ICD-10-CM | POA: Diagnosis present

## 2019-08-05 LAB — SARS CORONAVIRUS 2 (TAT 6-24 HRS): SARS Coronavirus 2: NEGATIVE

## 2019-08-06 ENCOUNTER — Encounter (HOSPITAL_COMMUNITY)
Admission: RE | Disposition: A | Discharge status: Home / Self Care | Payer: Self-pay | Source: Home / Self Care | Attending: Gastroenterology

## 2019-08-06 ENCOUNTER — Encounter (HOSPITAL_COMMUNITY): Payer: Self-pay | Admitting: Gastroenterology

## 2019-08-06 ENCOUNTER — Ambulatory Visit (HOSPITAL_COMMUNITY)
Admission: RE | Admit: 2019-08-06 | Discharge: 2019-08-06 | Disposition: A | Discharge status: Home / Self Care | Payer: Medicare Other | Attending: Gastroenterology | Admitting: Gastroenterology

## 2019-08-06 DIAGNOSIS — R159 Full incontinence of feces: Secondary | ICD-10-CM | POA: Insufficient documentation

## 2019-08-06 HISTORY — PX: ANAL RECTAL MANOMETRY: SHX6358

## 2019-08-06 SURGERY — MANOMETRY, ANORECTAL

## 2019-08-06 NOTE — Progress Notes (Signed)
Anal Manometry done per protocol. Patient tolerated well. Pt was unable to relax anal muscles well. I was able to get the probe in slowly and not as far as would have been best for study however there was resistance and patient was not able to really relax those muscles for insertion. Pt denied pain during the test. There was small amount of bleeding during insertion but very small amount when done. Pt denied any blood while getting dressed. It was such a struggle to to insert probe and have him relax muscles, did not do the balloon expulsion test.

## 2020-01-25 ENCOUNTER — Ambulatory Visit
Admission: EM | Admit: 2020-01-25 | Discharge: 2020-01-25 | Disposition: A | Discharge status: Home / Self Care | Payer: Medicare Other | Attending: Emergency Medicine | Admitting: Emergency Medicine

## 2020-01-25 ENCOUNTER — Other Ambulatory Visit: Payer: Self-pay

## 2020-01-25 ENCOUNTER — Encounter (HOSPITAL_COMMUNITY): Payer: Self-pay

## 2020-01-25 DIAGNOSIS — R110 Nausea: Secondary | ICD-10-CM | POA: Diagnosis not present

## 2020-01-25 DIAGNOSIS — Z79899 Other long term (current) drug therapy: Secondary | ICD-10-CM | POA: Insufficient documentation

## 2020-01-25 DIAGNOSIS — R519 Headache, unspecified: Secondary | ICD-10-CM | POA: Insufficient documentation

## 2020-01-25 DIAGNOSIS — Z96652 Presence of left artificial knee joint: Secondary | ICD-10-CM | POA: Diagnosis not present

## 2020-01-25 DIAGNOSIS — I10 Essential (primary) hypertension: Secondary | ICD-10-CM | POA: Insufficient documentation

## 2020-01-25 DIAGNOSIS — R112 Nausea with vomiting, unspecified: Secondary | ICD-10-CM | POA: Insufficient documentation

## 2020-01-25 LAB — COMPREHENSIVE METABOLIC PANEL
ALT: 21 U/L (ref 0–44)
AST: 19 U/L (ref 15–41)
Albumin: 4.3 g/dL (ref 3.5–5.0)
Alkaline Phosphatase: 62 U/L (ref 38–126)
Anion gap: 11 (ref 5–15)
BUN: 13 mg/dL (ref 8–23)
CO2: 24 mmol/L (ref 22–32)
Calcium: 9.6 mg/dL (ref 8.9–10.3)
Chloride: 103 mmol/L (ref 98–111)
Creatinine, Ser: 0.82 mg/dL (ref 0.61–1.24)
GFR, Estimated: >60 mL/min (ref >60)
Glucose, Bld: 115 mg/dL — ABNORMAL HIGH (ref 70–99)
Potassium: 3.6 mmol/L (ref 3.5–5.1)
Sodium: 138 mmol/L (ref 135–145)
Total Bilirubin: 1.3 mg/dL — ABNORMAL HIGH (ref 0.3–1.2)
Total Protein: 7.5 g/dL (ref 6.5–8.1)

## 2020-01-25 LAB — CBC
HCT: 46.1 % (ref 39.0–52.0)
Hemoglobin: 15.8 g/dL (ref 13.0–17.0)
MCH: 31.4 pg (ref 26.0–34.0)
MCHC: 34.3 g/dL (ref 30.0–36.0)
MCV: 91.7 fL (ref 80.0–100.0)
Platelets: 237 10*3/uL (ref 150–400)
RBC: 5.03 MIL/uL (ref 4.22–5.81)
RDW: 13.1 % (ref 11.5–15.5)
WBC: 9.6 10*3/uL (ref 4.0–10.5)
nRBC: 0 % (ref 0.0–0.2)

## 2020-01-25 LAB — LIPASE, BLOOD: Lipase: 30 U/L (ref 11–51)

## 2020-01-25 MED ORDER — ONDANSETRON 4 MG PO TBDP
4.0000 mg | ORAL_TABLET | Freq: Once | ORAL | Status: AC
  Administered 2020-01-25: 4 mg via ORAL

## 2020-01-25 MED ORDER — ONDANSETRON 4 MG PO TBDP
4.0000 mg | ORAL_TABLET | Freq: Three times a day (TID) | ORAL | 0 refills | Status: DC | PRN
Start: 2020-01-25 — End: 2023-12-22

## 2020-01-25 NOTE — ED Provider Notes (Signed)
[facility]    CSN: [id number] Arrival date & time: 01/25/20  1000      History   Chief Complaint Chief Complaint  Patient presents with  . Nausea    HPI Danny Cobb is a 74 y.o. male  With extensive history as below presenting for nausea and abdominal discomfort that began yesterday after eating dinner.  States he believes it is related to what he ate: Fish and vegetables.  No vomiting, though he feels this could make it better.  Denying increased belching, drooling, flatus.  No history of stricture or choking.  Denies recent change in medications.   Past Medical History:  Diagnosis Date  . Anxiety   . Arthritis   . Cataract    right eye  . Colon polyps   . Elevated PSA   . History of kidney stones    over 10 yrs ago  . Hypertension 12-20-10   tx. Lotrel  . Hypertrophy of prostate without urinary obstruction and other lower urinary tract symptoms (LUTS)   . Lipoma of neck October 2012   posterior neck lesion   . Other malaise and fatigue   . Tubular adenoma 09/2010    Patient Active Problem List   Diagnosis Date Noted  . Hypokalemia 03/17/2016  . Dehydration   . UTI (urinary tract infection) 03/15/2016  . Orthostasis 03/15/2016  . Essential hypertension 03/15/2016  . Acute urinary retention 03/08/2016  . Primary osteoarthritis of left knee 03/01/2016  . Sebaceous cyst - midback - 3 cm 12/13/2010  . Lipoma of scalp - 4 cm 12/13/2010    Past Surgical History:  Procedure Laterality Date  . ANAL RECTAL MANOMETRY N/A 08/06/2019   Procedure: ANO RECTAL MANOMETRY;  Surgeon: Wilford Corner, MD;  Location: [facility];  Service: Endoscopy;  Laterality: N/A;  . KNEE ARTHROCENTESIS  1989   left knee -scope  . MASS EXCISION  12/24/2010   Procedure: EXCISION MASS;  Surgeon: Imogene Burn. Georgette Dover, MD;  [facility];  Service: General;  Laterality: N/A;  Excision of Sebaceous Masses  . PROSTATE SURGERY  2010   prostate biopsy-last done   .  TONSILLECTOMY    . 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 Authorizing Provider  amLODipine-benazepril (LOTREL) 5-10 MG per capsule Take 1 capsule by mouth daily.     [provider]  ondansetron (ZOFRAN ODT) 4 MG disintegrating tablet Take 1 tablet (4 mg total) by mouth every 8 (eight) hours as needed for nausea or vomiting. 01/25/20   Hall-Potvin, [place], PA-C    Family History Family History  Problem Relation Age of Onset  . Cancer Mother     Social History Social History   Tobacco Use  . Smoking status: Never Smoker  . Smokeless tobacco: Never Used  Vaping Use  . Vaping Use: Never used  Substance Use Topics  . Alcohol use: Yes    Comment: 1 glass per week  . Drug use: No     Allergies   Patient has no known allergies.   Review of Systems Review of Systems  Constitutional: Positive for appetite change and fatigue. Negative for fever.  Respiratory: Negative for cough and shortness of breath.   Cardiovascular: Negative for chest pain and palpitations.  Gastrointestinal: Positive for abdominal pain and nausea. Negative for diarrhea and vomiting.  Genitourinary: Negative for difficulty urinating and frequency.  Musculoskeletal: Negative for arthralgias and myalgias.  Skin: Negative for rash and wound.  Neurological: Negative for speech difficulty and headaches.  All other systems reviewed and are negative.    Physical Exam Triage Vital Signs ED Triage Vitals  Enc Vitals Group     BP 01/25/20 1032 (!) 196/110     Pulse Rate 01/25/20 1032 64     Resp 01/25/20 1032 18     Temp 01/25/20 1032 97.9 F (36.6 C)     Temp Source 01/25/20 1032 Oral     SpO2 01/25/20 1032 95 %     Weight 01/25/20 1030 215 lb (97.5 kg)     Height  01/25/20 1030 5\' 8"  (1.727 m)     Head Circumference --      Peak Flow --      Pain Score 01/25/20 1030 0     Pain Loc --      Pain Edu? --      Excl. in [place]? --    No data found.  Updated Vital Signs BP (!) 146/93 (BP Location: Left Arm)   Pulse 64   Temp 97.9 F (36.6 C) (Oral)   Resp 18   Ht 5\' 8"  (1.727 m)   Wt 215 lb (97.5 kg)   SpO2 95%   BMI 32.69 kg/m   Visual Acuity Right Eye Distance:   Left Eye Distance:   Bilateral Distance:    Right Eye Near:   Left Eye Near:    Bilateral Near:     Physical Exam Constitutional:      General: He is not in acute distress.    Appearance: He is not toxic-appearing or diaphoretic.  HENT:     Head: Normocephalic and atraumatic.     Mouth/Throat:     Mouth: Mucous membranes are moist.     Pharynx: Oropharynx is clear. No posterior oropharyngeal erythema.  Eyes:     General: No scleral icterus.    Conjunctiva/sclera: Conjunctivae normal.     Pupils: Pupils are equal, round, and reactive to light.  Neck:     Comments: Trachea midline, negative JVD Cardiovascular:     Rate and Rhythm: Normal rate and regular rhythm.  Pulmonary:     Effort: Pulmonary effort is normal. No respiratory distress.     Breath sounds: No wheezing.  Abdominal:     General: Bowel sounds are normal. There is no distension.     Palpations: Abdomen is soft. There is no mass.     Tenderness: There is no right CVA tenderness, left CVA tenderness, guarding or rebound.     Hernia: No hernia is present.     Comments: Mild right-sided tenderness negative Murphy's, Burney's, Rovsing sign  Musculoskeletal:     Cervical back: Neck supple. No tenderness.  Lymphadenopathy:     Cervical: No cervical adenopathy.  Skin:    Capillary Refill: Capillary refill takes less than 2 seconds.     Coloration: Skin is not jaundiced or pale.     Findings: No rash.  Neurological:     Mental Status: He is alert and oriented to person, place, and time.      UC  Treatments / Results  Labs (all labs ordered are listed, but only abnormal results are displayed) Labs Reviewed - No data to display  EKG   Radiology No results found.  Procedures Procedures (including  critical care time)  Medications Ordered in UC Medications  ondansetron (ZOFRAN-ODT) disintegrating tablet 4 mg (4 mg Oral Given 01/25/20 1125)    Initial Impression / Assessment and Plan / UC Course  I have reviewed the triage vital signs and the nursing notes.  Pertinent labs & imaging results that were available during my care of the patient were reviewed by me and considered in my medical decision making (see chart for details).     Afebrile, nontoxic, no acute distress.  No signs or symptoms of acute abdomen.  Patient's age and medical comorbidities increased risk of variety of ailments including food poisoning, reflux, cholecystitis, colitis.  Will treat supportively, push fluids, and have low threshold to go to ER for further evaluation.  Return precautions discussed, pt verbalized understanding and is agreeable to plan. Final Clinical Impressions(s) / UC Diagnoses   Final diagnoses:  Nausea without vomiting   Discharge Instructions   None    ED Prescriptions    Medication Sig Dispense Auth. Provider   ondansetron (ZOFRAN ODT) 4 MG disintegrating tablet Take 1 tablet (4 mg total) by mouth every 8 (eight) hours as needed for nausea or vomiting. 21 tablet Hall-Potvin, [place], PA-C     PDMP not reviewed this encounter.   Hall-Potvin, [place], [place] 01/25/20 1342

## 2020-01-25 NOTE — ED Triage Notes (Signed)
Pt states "I can't regurgitate"-c/o nausea started yesterday after eating-pt with constant dry heaving-denies pain

## 2020-01-25 NOTE — ED Triage Notes (Signed)
Patient arrives with complaints of a headache and lower abdominal pain that started yesterday at 2pm. Complaints of nausea and one episode of vomiting. Last dose of Zofran at 4pm today.

## 2020-01-26 ENCOUNTER — Emergency Department (HOSPITAL_COMMUNITY)
Admission: EM | Admit: 2020-01-26 | Discharge: 2020-01-26 | Disposition: A | Discharge status: Home / Self Care | Payer: Medicare Other | Attending: Emergency Medicine | Admitting: Emergency Medicine

## 2020-01-26 DIAGNOSIS — R112 Nausea with vomiting, unspecified: Secondary | ICD-10-CM

## 2020-01-26 DIAGNOSIS — R519 Headache, unspecified: Secondary | ICD-10-CM

## 2020-01-26 LAB — URINALYSIS, ROUTINE W REFLEX MICROSCOPIC
Bilirubin Urine: NEGATIVE
Glucose, UA: NEGATIVE mg/dL
Hgb urine dipstick: NEGATIVE
Ketones, ur: 5 mg/dL — AB
Leukocytes,Ua: NEGATIVE
Nitrite: NEGATIVE
Protein, ur: NEGATIVE mg/dL
Specific Gravity, Urine: 1.026 (ref 1.005–1.030)
pH: 5 (ref 5.0–8.0)

## 2020-01-26 MED ORDER — PROCHLORPERAZINE EDISYLATE 10 MG/2ML IJ SOLN
10.0000 mg | Freq: Once | INTRAMUSCULAR | Status: AC
  Administered 2020-01-26: 10 mg via INTRAVENOUS
  Filled 2020-01-26: qty 2

## 2020-01-26 MED ORDER — LACTATED RINGERS IV BOLUS
1000.0000 mL | Freq: Once | INTRAVENOUS | Status: AC
  Administered 2020-01-26: 1000 mL via INTRAVENOUS

## 2020-01-26 MED ORDER — PROCHLORPERAZINE MALEATE 10 MG PO TABS
10.0000 mg | ORAL_TABLET | Freq: Four times a day (QID) | ORAL | 0 refills | Status: DC | PRN
Start: 2020-01-26 — End: 2023-12-22

## 2020-01-26 NOTE — ED Provider Notes (Signed)
[facility] DEPT Provider Note   CSN: [id number] Arrival date & time: 01/25/20  1935   History Chief complaint: Headache, vomiting  Danny Cobb is a 74 y.o. male.  The history is provided by the patient.  He has had nausea and a bifrontal headache over the last 2 days.  He relates that something that he ate the day prior.  He has not vomited.  He denies any visual change or photophobia or phonophobia.  Headache is rated at 6/10.  He had gone to urgent care earlier in the day and was given a dose of ondansetron with slight relief, but symptoms got worse after going home.  He denies fever or chills.  He denies any diarrhea.  Past Medical History:  Diagnosis Date  . Anxiety   . Arthritis   . Cataract    right eye  . Colon polyps   . Elevated PSA   . History of kidney stones    over 10 yrs ago  . Hypertension 12-20-10   tx. Lotrel  . Hypertrophy of prostate without urinary obstruction and other lower urinary tract symptoms (LUTS)   . Lipoma of neck October 2012   posterior neck lesion   . Other malaise and fatigue   . Tubular adenoma 09/2010    Patient Active Problem List   Diagnosis Date Noted  . Hypokalemia 03/17/2016  . Dehydration   . UTI (urinary tract infection) 03/15/2016  . Orthostasis 03/15/2016  . Essential hypertension 03/15/2016  . Acute urinary retention 03/08/2016  . Primary osteoarthritis of left knee 03/01/2016  . Sebaceous cyst - midback - 3 cm 12/13/2010  . Lipoma of scalp - 4 cm 12/13/2010    Past Surgical History:  Procedure Laterality Date  . ANAL RECTAL MANOMETRY N/A 08/06/2019   Procedure: ANO RECTAL MANOMETRY;  Surgeon: Wilford Corner, MD;  [facility]: [facility];  Service: Endoscopy;  Laterality: N/A;  . KNEE ARTHROCENTESIS  1989   left knee -scope  . MASS EXCISION  12/24/2010   Procedure: EXCISION MASS;  Surgeon: Imogene Burn. Georgette Dover, MD;  Location[facility];  Service: General;  Laterality: N/A;  Excision of  Sebaceous Masses  . PROSTATE SURGERY  2010   prostate biopsy-last done   . TONSILLECTOMY    . TOTAL KNEE ARTHROPLASTY Left 03/01/2016   Procedure: TOTAL KNEE ARTHROPLASTY;  Surgeon: Dorna Leitz, MD;  Location: [place];  Service: Orthopedics;  Laterality: Left;       Family History  Problem Relation Age of Onset  . Cancer Mother     Social History   Tobacco Use  . Smoking status: Never Smoker  . Smokeless tobacco: Never Used  Vaping Use  . Vaping Use: Never used  Substance Use Topics  . Alcohol use: Yes    Comment: 1 glass per week  . Drug use: No    Home Medications Prior to Admission medications   Medication Sig Start Date End Date Taking? Authorizing Provider  amLODipine-benazepril (LOTREL) 5-10 MG per capsule Take 1 capsule by mouth daily.   Yes [provider]  ondansetron (ZOFRAN ODT) 4 MG disintegrating tablet Take 1 tablet (4 mg total) by mouth every 8 (eight) hours as needed for nausea or vomiting. 01/25/20  Yes Hall-Potvin, [place], PA-C    Allergies    Patient has no known allergies.  Review of Systems   Review of Systems  All other systems reviewed and are negative.   Physical Exam Updated Vital Signs BP (!) 146/90  Pulse (!) 54   Temp 98.2 F (36.8 C) (Oral)   Resp 14   Ht 5\' 8"  (1.727 m)   Wt 97.5 kg   SpO2 98%   BMI 32.69 kg/m   Physical Exam Vitals and nursing note reviewed.   74 year old male, resting comfortably and in no acute distress. Vital signs are significant for mildly elevated blood pressure. Oxygen saturation is 98%, which is normal. Head is normocephalic and atraumatic. PERRLA, EOMI. Oropharynx is clear. Neck is nontender and supple without adenopathy or JVD. Back is nontender and there is no CVA tenderness. Lungs are clear without rales, wheezes, or rhonchi. Chest is nontender. Heart has regular rate and rhythm without murmur. Abdomen is soft, flat, nontender without masses or hepatosplenomegaly and peristalsis is  hypoactive. Extremities have no cyanosis or edema, full range of motion is present. Skin is warm and dry without rash. Neurologic: Mental status is normal, cranial nerves are intact, there are no motor or sensory deficits.  ED Results / Procedures / Treatments   Labs (all labs ordered are listed, but only abnormal results are displayed) Labs Reviewed  COMPREHENSIVE METABOLIC PANEL - Abnormal; Notable for the following components:      Result Value   Glucose, Bld 115 (*)    Total Bilirubin 1.3 (*)    All other components within normal limits  URINALYSIS, ROUTINE W REFLEX MICROSCOPIC - Abnormal; Notable for the following components:   Ketones, ur 5 (*)    All other components within normal limits  LIPASE, BLOOD  CBC   Procedures Procedures   Medications Ordered in ED Medications  lactated ringers bolus 1,000 mL (has no administration in time range)  prochlorperazine (COMPAZINE) injection 10 mg (has no administration in time range)    ED Course  I have reviewed the triage vital signs and the nursing notes.  Pertinent lab results that were available during my care of the patient were reviewed by me and considered in my medical decision making (see chart for details).  MDM Rules/Calculators/A&P Headache and nausea.  It is possible that this is related to a viral gastritis, possible headache complex such as muscle contraction headache.  Labs obtained on arrival are reassuring.  Old records reviewed confirming urgent care visit earlier in the day.  He will be given a headache cocktail of lactated Ringer's and prochlorperazine and reassessed.  He is feeling much better following above-noted treatment. Headache is gone and nausea has resolved. He is discharged with prescription for prochlorperazine.  Final Clinical Impression(s) / ED Diagnoses Final diagnoses:  Bad headache  Non-intractable vomiting with nausea, unspecified vomiting type    Rx / DC Orders ED Discharge Orders          Ordered    prochlorperazine (COMPAZINE) 10 MG tablet  Every 6 hours PRN        01/26/20 4034           Delora Fuel, MD 74/[birth year]/95 [phone]

## 2020-01-26 NOTE — Discharge Instructions (Addendum)
Return if you are having any problems. 

## 2020-02-22 DIAGNOSIS — Z96652 Presence of left artificial knee joint: Secondary | ICD-10-CM | POA: Diagnosis not present

## 2020-02-22 DIAGNOSIS — M25562 Pain in left knee: Secondary | ICD-10-CM | POA: Diagnosis not present

## 2020-04-24 DIAGNOSIS — R350 Frequency of micturition: Secondary | ICD-10-CM | POA: Diagnosis not present

## 2020-04-24 DIAGNOSIS — R3911 Hesitancy of micturition: Secondary | ICD-10-CM | POA: Diagnosis not present

## 2020-05-03 DIAGNOSIS — I10 Essential (primary) hypertension: Secondary | ICD-10-CM | POA: Diagnosis not present

## 2020-05-03 DIAGNOSIS — Z0000 Encounter for general adult medical examination without abnormal findings: Secondary | ICD-10-CM | POA: Diagnosis not present

## 2020-07-31 ENCOUNTER — Emergency Department (HOSPITAL_BASED_OUTPATIENT_CLINIC_OR_DEPARTMENT_OTHER)
Admission: EM | Admit: 2020-07-31 | Discharge: 2020-07-31 | Disposition: A | Discharge status: Home / Self Care | Payer: Medicare Other | Attending: Emergency Medicine | Admitting: Emergency Medicine

## 2020-07-31 ENCOUNTER — Encounter (HOSPITAL_BASED_OUTPATIENT_CLINIC_OR_DEPARTMENT_OTHER): Payer: Self-pay | Admitting: *Deleted

## 2020-07-31 DIAGNOSIS — R300 Dysuria: Secondary | ICD-10-CM | POA: Diagnosis not present

## 2020-07-31 DIAGNOSIS — R1030 Lower abdominal pain, unspecified: Secondary | ICD-10-CM | POA: Diagnosis not present

## 2020-07-31 DIAGNOSIS — Z79899 Other long term (current) drug therapy: Secondary | ICD-10-CM | POA: Diagnosis not present

## 2020-07-31 DIAGNOSIS — I10 Essential (primary) hypertension: Secondary | ICD-10-CM | POA: Diagnosis not present

## 2020-07-31 DIAGNOSIS — R339 Retention of urine, unspecified: Secondary | ICD-10-CM | POA: Diagnosis not present

## 2020-07-31 DIAGNOSIS — Z96652 Presence of left artificial knee joint: Secondary | ICD-10-CM | POA: Diagnosis not present

## 2020-07-31 LAB — CBC WITH DIFFERENTIAL/PLATELET
Abs Immature Granulocytes: 0.05 10*3/uL (ref 0.00–0.07)
Basophils Absolute: 0.1 10*3/uL (ref 0.0–0.1)
Basophils Relative: 1 %
Eosinophils Absolute: 0 10*3/uL (ref 0.0–0.5)
Eosinophils Relative: 0 %
HCT: 47.9 % (ref 39.0–52.0)
Hemoglobin: 16.4 g/dL (ref 13.0–17.0)
Immature Granulocytes: 1 %
Lymphocytes Relative: 9 %
Lymphs Abs: 0.9 10*3/uL (ref 0.7–4.0)
MCH: 31.5 pg (ref 26.0–34.0)
MCHC: 34.2 g/dL (ref 30.0–36.0)
MCV: 91.9 fL (ref 80.0–100.0)
Monocytes Absolute: 0.5 10*3/uL (ref 0.1–1.0)
Monocytes Relative: 5 %
Neutro Abs: 8.3 10*3/uL — ABNORMAL HIGH (ref 1.7–7.7)
Neutrophils Relative %: 84 %
Platelets: 217 10*3/uL (ref 150–400)
RBC: 5.21 MIL/uL (ref 4.22–5.81)
RDW: 13.4 % (ref 11.5–15.5)
WBC: 9.9 10*3/uL (ref 4.0–10.5)
nRBC: 0 % (ref 0.0–0.2)

## 2020-07-31 LAB — BASIC METABOLIC PANEL
Anion gap: 10 (ref 5–15)
BUN: 11 mg/dL (ref 8–23)
CO2: 22 mmol/L (ref 22–32)
Calcium: 9.9 mg/dL (ref 8.9–10.3)
Chloride: 104 mmol/L (ref 98–111)
Creatinine, Ser: 1.11 mg/dL (ref 0.61–1.24)
GFR, Estimated: >60 mL/min (ref >60)
Glucose, Bld: 157 mg/dL — ABNORMAL HIGH (ref 70–99)
Potassium: 4.1 mmol/L (ref 3.5–5.1)
Sodium: 136 mmol/L (ref 135–145)

## 2020-07-31 LAB — URINALYSIS, ROUTINE W REFLEX MICROSCOPIC
Bilirubin Urine: NEGATIVE
Glucose, UA: NEGATIVE mg/dL
Ketones, ur: NEGATIVE mg/dL
Leukocytes,Ua: NEGATIVE
Nitrite: NEGATIVE
Protein, ur: NEGATIVE mg/dL
Specific Gravity, Urine: 1.01 (ref 1.005–1.030)
pH: 7 (ref 5.0–8.0)

## 2020-07-31 LAB — URINALYSIS, MICROSCOPIC (REFLEX)
Squamous Epithelial / HPF: NONE SEEN (ref 0–5)
WBC, UA: NONE SEEN WBC/hpf (ref 0–5)

## 2020-07-31 MED ORDER — CEPHALEXIN 500 MG PO CAPS
500.0000 mg | ORAL_CAPSULE | Freq: Three times a day (TID) | ORAL | 0 refills | Status: DC
Start: 2020-07-31 — End: 2023-12-17

## 2020-07-31 NOTE — ED Provider Notes (Signed)
[facility] EMERGENCY DEPARTMENT Provider Note   CSN: [id number] Arrival date & time: 07/31/20  0756     History Chief Complaint  Patient presents with   Abdominal Pain    Danny Cobb is a 75 y.o. male.  He has a history of prostate enlargement and has had to catheterize himself intermittently for a while.  Did again last week.  Has not urinated since last night.  Woke up and tried to catheterize himself this morning but did not get any urine out.  Complaining of 10 out of 10 suprapubic pain.  No nausea or vomiting.  No constipation.  No hematuria.  Does think he had a little bit of stinging when he last urinated.  The history is provided by the patient.  Abdominal Pain Pain location:  Suprapubic Pain quality: aching   Pain radiates to:  Perineum Pain severity:  Severe Onset quality:  Gradual Duration:  3 hours Timing:  Constant Progression:  Unchanged Chronicity:  New Relieved by:  Nothing Worsened by:  Nothing Ineffective treatments:  Urination Associated symptoms: dysuria   Associated symptoms: no chest pain, no chills, no constipation, no diarrhea, no fever, no hematuria, no nausea, no shortness of breath, no sore throat and no vomiting       Past Medical History:  Diagnosis Date   Anxiety    Arthritis    Cataract    right eye   Colon polyps    Elevated PSA    History of kidney stones    over 10 yrs ago   Hypertension 12-20-10   tx. Lotrel   Hypertrophy of prostate without urinary obstruction and other lower urinary tract symptoms (LUTS)    Lipoma of neck October 2012   posterior neck lesion    Other malaise and fatigue    Tubular adenoma 09/2010    Patient Active Problem List   Diagnosis Date Noted   Hypokalemia 03/17/2016   Dehydration    UTI (urinary tract infection) 03/15/2016   Orthostasis 03/15/2016   Essential hypertension 03/15/2016   Acute urinary retention 03/08/2016   Primary osteoarthritis of left knee 03/01/2016   Sebaceous  cyst - midback - 3 cm 12/13/2010   Lipoma of scalp - 4 cm 12/13/2010    Past Surgical History:  Procedure Laterality Date   ANAL RECTAL MANOMETRY N/A 08/06/2019   Procedure: ANO RECTAL MANOMETRY;  Surgeon: Wilford Corner, MD;  Location: [facility];  Service: Endoscopy;  Laterality: N/A;   KNEE ARTHROCENTESIS  1989   left knee -scope   MASS EXCISION  12/24/2010   Procedure: EXCISION MASS;  Surgeon: Imogene Burn. Tsuei, MD;  Location: [facility];  Service: General;  Laterality: N/A;  Excision of Sebaceous Masses   PROSTATE SURGERY  2010   prostate biopsy-last done    TONSILLECTOMY     TOTAL KNEE ARTHROPLASTY Left 03/01/2016   Procedure: TOTAL KNEE ARTHROPLASTY;  Surgeon: Dorna Leitz, MD;  Location: [facility];  Service: Orthopedics;  Laterality: Left;       Family History  Problem Relation Age of Onset   Cancer Mother     Social History   Tobacco Use   Smoking status: Never   Smokeless tobacco: Never  Vaping Use   Vaping Use: Never used  Substance Use Topics   Alcohol use: Yes    Comment: 1 glass per week   Drug use: No    Home Medications Prior to Admission medications   Medication Sig Start Date End Date Taking? Authorizing Provider  amLODipine-benazepril (LOTREL) 5-10 MG per capsule Take 1 capsule by mouth daily.    [provider]  ondansetron (ZOFRAN ODT) 4 MG disintegrating tablet Take 1 tablet (4 mg total) by mouth every 8 (eight) hours as needed for nausea or vomiting. 01/25/20   Hall-Potvin, [place], PA-C  prochlorperazine (COMPAZINE) 10 MG tablet Take 1 tablet (10 mg total) by mouth every 6 (six) hours as needed for nausea or vomiting (or headache). 78/67/67   Delora Fuel, MD    Allergies    Patient has no known allergies.  Review of Systems   Review of Systems  Constitutional:  Negative for chills and fever.  HENT:  Negative for sore throat.   Eyes:  Negative for visual disturbance.  Respiratory:  Negative for shortness of breath.   Cardiovascular:   Negative for chest pain.  Gastrointestinal:  Positive for abdominal pain. Negative for constipation, diarrhea, nausea and vomiting.  Genitourinary:  Positive for difficulty urinating and dysuria. Negative for hematuria.  Musculoskeletal:  Negative for neck pain.  Skin:  Negative for rash.  Neurological:  Negative for headaches.   Physical Exam Updated Vital Signs BP (!) 155/108 (BP Location: Right Arm)   Pulse (!) 103   Temp 98 F (36.7 C) (Oral)   Resp 20   Ht 5\' 8"  (1.727 m)   Wt 97.5 kg   SpO2 99%   BMI 32.69 kg/m   Physical Exam Vitals and nursing note reviewed.  Constitutional:      Appearance: He is well-developed.  HENT:     Head: Normocephalic and atraumatic.  Eyes:     Conjunctiva/sclera: Conjunctivae normal.  Cardiovascular:     Rate and Rhythm: Normal rate and regular rhythm.     Heart sounds: No murmur heard. Pulmonary:     Effort: Pulmonary effort is normal. No respiratory distress.     Breath sounds: Normal breath sounds.  Abdominal:     Palpations: Abdomen is soft.     Tenderness: There is abdominal tenderness (suprapubic).  Musculoskeletal:        General: No deformity or signs of injury. Normal range of motion.     Cervical back: Neck supple.  Skin:    General: Skin is warm and dry.  Neurological:     General: No focal deficit present.     Mental Status: He is alert.    ED Results / Procedures / Treatments   Labs (all labs ordered are listed, but only abnormal results are displayed) Labs Reviewed  URINALYSIS, ROUTINE W REFLEX MICROSCOPIC - Abnormal; Notable for the following components:      Result Value   Hgb urine dipstick TRACE (*)    All other components within normal limits  BASIC METABOLIC PANEL - Abnormal; Notable for the following components:   Glucose, Bld 157 (*)    All other components within normal limits  CBC WITH DIFFERENTIAL/PLATELET - Abnormal; Notable for the following components:   Neutro Abs 8.3 (*)    All other  components within normal limits  URINALYSIS, MICROSCOPIC (REFLEX) - Abnormal; Notable for the following components:   Bacteria, UA RARE (*)    All other components within normal limits  URINE CULTURE    EKG None  Radiology No results found.  Procedures Procedures   Medications Ordered in ED Medications - No data to display  ED Course  I have reviewed the triage vital signs and the nursing notes.  Pertinent labs & imaging results that were available during my care of the  patient were reviewed by me and considered in my medical decision making (see chart for details).  Clinical Course as of 07/31/20 1748  Mon Jul 31, 2020  0174 Bladder scan showing almost 700 cc of urine.  Have ordered Foley catheter. [MB]  [id number] Patient had a Foley catheter placed by the nurse.  900+ cc out and had good relief of his abdominal discomfort. [MB]    Clinical Course User Index [MB] Hayden Rasmussen, MD   MDM Rules/Calculators/A&P                         This patient complains of lower abdominal pain and decreased urine output; this involves an extensive number of treatment Options and is a complaint that carries with it a high risk of complications and Morbidity. The differential includes urinary retention, UTI, prostatitis, diverticulitis, colitis, appendicitis  I ordered, reviewed and interpreted labs, which included CBC which showed a normal white count normal hemoglobin, chemistries normal other than mildly elevated glucose, urinalysis equivocal for infection I ordered imaging studies which included bladder scan and I independently    visualized and interpreted imaging which showed greater than 700 cc in the bladder  Previous records obtained and reviewed in epic, no recent visits for similar presentation  After the interventions stated above, I reevaluated the patient and found patient symptoms to be much improved.  Tachycardia and hypertension also better.  We discussed Foley management  at home and he already has [facility].  Will cover with antibiotics for possible infectious precipitant.  Return instructions discussed   Final Clinical Impression(s) / ED Diagnoses Final diagnoses:  Urinary retention    Rx / DC Orders ED Discharge Orders          Ordered    cephALEXin (KEFLEX) 500 MG capsule  3 times daily        07/31/20 0925             Hayden Rasmussen, MD 07/31/20 1750

## 2020-07-31 NOTE — ED Notes (Signed)
ED Provider at bedside. 

## 2020-07-31 NOTE — ED Triage Notes (Signed)
Presents today, has had to catheterize himself approx 5 days ago secondary to enlarged prostate. Thinks he may have UTI

## 2020-07-31 NOTE — Discharge Instructions (Addendum)
You are seen in the emergency department for low abdominal pain and difficulty passing urine.  You had urinary retention.  Please leave the catheter in place and follow-up with [facility].  We are prescribing some antibiotics to take for possible infection.  Return to the emergency department for any worsening or concerning symptoms

## 2020-07-31 NOTE — ED Notes (Signed)
BLADDER SCAN - 697ML

## 2020-08-01 LAB — URINE CULTURE: Culture: NO GROWTH

## 2020-08-07 DIAGNOSIS — R338 Other retention of urine: Secondary | ICD-10-CM | POA: Diagnosis not present

## 2020-08-08 DIAGNOSIS — R338 Other retention of urine: Secondary | ICD-10-CM | POA: Diagnosis not present

## 2020-08-10 ENCOUNTER — Ambulatory Visit: Payer: Medicare Other | Admitting: Podiatry

## 2020-08-10 ENCOUNTER — Encounter: Payer: Self-pay | Admitting: Podiatry

## 2020-08-10 ENCOUNTER — Other Ambulatory Visit: Payer: Self-pay

## 2020-08-10 DIAGNOSIS — Q828 Other specified congenital malformations of skin: Secondary | ICD-10-CM

## 2020-08-10 NOTE — Progress Notes (Signed)
  Subjective:  Patient ID: Danny Cobb, male    DOB: 22-Dec-1945,  MRN: [id number]  Chief Complaint  Patient presents with   Callouses     (np) left foot has an area on the bottom of foot thats causing discomfort while walking    75 y.o. male presents with the above complaint. History confirmed with patient.   Objective:  Physical Exam: warm, good capillary refill, no trophic changes or ulcerative lesions, normal DP and PT pulses, and normal sensory exam. Left Foot: Submetatarsal 4 porokeratosis  Assessment:   1. Porokeratosis      Plan:  Patient was evaluated and treated and all questions answered.  All symptomatic hyperkeratoses were safely debrided with a sterile #15 blade to patient's level of comfort without incident. We discussed preventative and palliative care of these lesions including supportive and accommodative shoegear, padding, prefabricated and custom molded accommodative orthoses, use of a pumice stone and lotions/creams daily.  Offloading pad dispensed and applied salinocaine ointment recommended urea cream which she will get   No follow-ups on file.

## 2020-08-10 NOTE — Patient Instructions (Signed)
Look for urea 40% cream or ointment and apply to the thickened dry skin / calluses. This can be bought over the counter, at a pharmacy or online such as [employer].  

## 2020-10-24 DIAGNOSIS — R350 Frequency of micturition: Secondary | ICD-10-CM | POA: Diagnosis not present

## 2020-10-27 DIAGNOSIS — L821 Other seborrheic keratosis: Secondary | ICD-10-CM | POA: Diagnosis not present

## 2020-10-27 DIAGNOSIS — L218 Other seborrheic dermatitis: Secondary | ICD-10-CM | POA: Diagnosis not present

## 2020-10-27 DIAGNOSIS — D225 Melanocytic nevi of trunk: Secondary | ICD-10-CM | POA: Diagnosis not present

## 2021-02-20 DIAGNOSIS — M25562 Pain in left knee: Secondary | ICD-10-CM | POA: Diagnosis not present

## 2021-04-19 DIAGNOSIS — R0981 Nasal congestion: Secondary | ICD-10-CM | POA: Diagnosis not present

## 2021-09-20 ENCOUNTER — Ambulatory Visit
Admission: EM | Admit: 2021-09-20 | Discharge: 2021-09-20 | Disposition: A | Discharge status: Home / Self Care | Payer: Medicare Other | Attending: Physician Assistant | Admitting: Physician Assistant

## 2021-09-20 DIAGNOSIS — T148XXA Other injury of unspecified body region, initial encounter: Secondary | ICD-10-CM | POA: Diagnosis not present

## 2021-09-20 MED ORDER — PREDNISONE 20 MG PO TABS: 40.0000 mg | ORAL_TABLET | Freq: Every day | ORAL | 0 refills | Status: AC

## 2021-09-20 MED ORDER — METHYLPREDNISOLONE ACETATE 40 MG/ML IJ SUSP
40.0000 mg | Freq: Once | INTRAMUSCULAR | Status: AC
  Administered 2021-09-20: 40 mg via INTRAMUSCULAR

## 2021-09-20 NOTE — ED Provider Notes (Signed)
[facility]    CSN: [id number] Arrival date & time: 09/20/21  4268      History   Chief Complaint Chief Complaint  Patient presents with   Rash    HPI Danny Cobb is a 76 y.o. male.   Patient here today for evaluation of itchy rash to his legs and abdomen that started 2 days ago.  He notes prior to symptom onset he had been at another property and in some grass and he questions if this might of caused symptoms.  He denies any other travel or concerns for rash.  He has not had any new foods.  He denies any trouble swallowing or trouble breathing.  He has tried showering, clearing bed clothes, etc. without significant relief.   The history is provided by the patient.  Rash Associated symptoms: no fever and no shortness of breath     Past Medical History:  Diagnosis Date   Anxiety    Arthritis    Cataract    right eye   Colon polyps    Elevated PSA    History of kidney stones    over 10 yrs ago   Hypertension 12-20-10   tx. Lotrel   Hypertrophy of prostate without urinary obstruction and other lower urinary tract symptoms (LUTS)    Lipoma of neck October 2012   posterior neck lesion    Other malaise and fatigue    Tubular adenoma 09/2010    Patient Active Problem List   Diagnosis Date Noted   Hypokalemia 03/17/2016   Dehydration    UTI (urinary tract infection) 03/15/2016   Orthostasis 03/15/2016   Essential hypertension 03/15/2016   Acute urinary retention 03/08/2016   Primary osteoarthritis of left knee 03/01/2016   Sebaceous cyst - midback - 3 cm 12/13/2010   Lipoma of scalp - 4 cm 12/13/2010    Past Surgical History:  Procedure Laterality Date   ANAL RECTAL MANOMETRY N/A 08/06/2019   Procedure: ANO RECTAL MANOMETRY;  Surgeon: Wilford Corner, MD;  Location: [facility];  Service: Endoscopy;  Laterality: N/A;   KNEE ARTHROCENTESIS  1989   left knee -scope   MASS EXCISION  12/24/2010   Procedure: EXCISION MASS;  Surgeon: Imogene Burn.  Tsuei, MD;  Location: [facility];  Service: General;  Laterality: N/A;  Excision of Sebaceous Masses   PROSTATE SURGERY  2010   prostate biopsy-last done    TONSILLECTOMY     TOTAL KNEE ARTHROPLASTY Left 03/01/2016   Procedure: TOTAL KNEE ARTHROPLASTY;  Surgeon: Dorna Leitz, MD;  Location: [facility];  Service: Orthopedics;  Laterality: Left;       Home Medications    Prior to Admission medications   Medication Sig Start Date End Date Taking? Authorizing Provider  predniSONE (DELTASONE) 20 MG tablet Take 2 tablets (40 mg total) by mouth daily with breakfast for 5 days. 09/20/21 09/25/21 Yes Francene Finders, PA-C  amLODipine-benazepril (LOTREL) 5-10 MG per capsule Take 1 capsule by mouth daily.    [provider]  cephALEXin (KEFLEX) 500 MG capsule Take 1 capsule (500 mg total) by mouth 3 (three) times daily. 07/31/20   Hayden Rasmussen, MD  ondansetron (ZOFRAN ODT) 4 MG disintegrating tablet Take 1 tablet (4 mg total) by mouth every 8 (eight) hours as needed for nausea or vomiting. 01/25/20   Hall-Potvin, [place], PA-C  prochlorperazine (COMPAZINE) 10 MG tablet Take 1 tablet (10 mg total) by mouth every 6 (six) hours as needed for nausea or vomiting (or headache).  24/23/53   Delora Fuel, MD    Family History Family History  Problem Relation Age of Onset   Cancer Mother     Social History Social History   Tobacco Use   Smoking status: Never   Smokeless tobacco: Never  Vaping Use   Vaping Use: Never used  Substance Use Topics   Alcohol use: Yes    Comment: 1 glass per week   Drug use: No     Allergies   Patient has no known allergies.   Review of Systems Review of Systems  Constitutional:  Negative for chills and fever.  HENT:  Negative for trouble swallowing.   Eyes:  Negative for discharge and redness.  Respiratory:  Negative for shortness of breath.   Skin:  Positive for rash. Negative for color change.  Neurological:  Negative for numbness.     Physical  Exam Triage Vital Signs ED Triage Vitals  Enc Vitals Group     BP 09/20/21 0950 114/69     Pulse Rate 09/20/21 0950 66     Resp 09/20/21 0950 17     Temp 09/20/21 0950 (!) 97.5 F (36.4 C)     Temp Source 09/20/21 0950 Oral     SpO2 09/20/21 0950 95 %     Weight --      Height --      Head Circumference --      Peak Flow --      Pain Score 09/20/21 0949 0     Pain Loc --      Pain Edu? --      Excl. in [place]? --    No data found.  Updated Vital Signs BP 114/69 (BP Location: Left Arm)   Pulse 66   Temp (!) 97.5 F (36.4 C) (Oral)   Resp 17   SpO2 95%      Physical Exam Vitals and nursing note reviewed.  Constitutional:      General: He is not in acute distress.    Appearance: Normal appearance. He is not ill-appearing.  HENT:     Head: Normocephalic and atraumatic.  Eyes:     Conjunctiva/sclera: Conjunctivae normal.  Cardiovascular:     Rate and Rhythm: Normal rate.  Pulmonary:     Effort: Pulmonary effort is normal.  Skin:    Comments: Scattered erythematous papules to lower trunk  Neurological:     Mental Status: He is alert.  Psychiatric:        Mood and Affect: Mood normal.        Behavior: Behavior normal.        Thought Content: Thought content normal.      UC Treatments / Results  Labs (all labs ordered are listed, but only abnormal results are displayed) Labs Reviewed - No data to display  EKG   Radiology No results found.  Procedures Procedures (including critical care time)  Medications Ordered in UC Medications  methylPREDNISolone acetate (DEPO-MEDROL) injection 40 mg (40 mg Intramuscular Given 09/20/21 1002)    Initial Impression / Assessment and Plan / UC Course  I have reviewed the triage vital signs and the nursing notes.  Pertinent labs & imaging results that were available during my care of the patient were reviewed by me and considered in my medical decision making (see chart for details).    Questionable chigger bites.  Steroid injection administered in office and prednisone prescribed to start taking orally tomorrow.  Encouraged follow-up if symptoms do not improve or worsen.  Final Clinical Impressions(s) / UC Diagnoses   Final diagnoses:  Bites   Discharge Instructions   None    ED Prescriptions     Medication Sig Dispense Auth. Provider   predniSONE (DELTASONE) 20 MG tablet Take 2 tablets (40 mg total) by mouth daily with breakfast for 5 days. 10 tablet Francene Finders, PA-C      PDMP not reviewed this encounter.   Francene Finders, PA-C 09/20/21 1038

## 2021-09-20 NOTE — ED Triage Notes (Signed)
Pt presents with rash on both legs and abdomen X 2 days that is itchy.

## 2022-01-17 DIAGNOSIS — Z23 Encounter for immunization: Secondary | ICD-10-CM | POA: Diagnosis not present

## 2022-01-17 DIAGNOSIS — R7303 Prediabetes: Secondary | ICD-10-CM | POA: Diagnosis not present

## 2022-01-17 DIAGNOSIS — Z0000 Encounter for general adult medical examination without abnormal findings: Secondary | ICD-10-CM | POA: Diagnosis not present

## 2022-01-17 DIAGNOSIS — I10 Essential (primary) hypertension: Secondary | ICD-10-CM | POA: Diagnosis not present

## 2022-01-17 DIAGNOSIS — Z79899 Other long term (current) drug therapy: Secondary | ICD-10-CM | POA: Diagnosis not present

## 2022-01-17 DIAGNOSIS — R7309 Other abnormal glucose: Secondary | ICD-10-CM | POA: Diagnosis not present

## 2022-01-17 DIAGNOSIS — Z5181 Encounter for therapeutic drug level monitoring: Secondary | ICD-10-CM | POA: Diagnosis not present

## 2022-02-20 DIAGNOSIS — H40033 Anatomical narrow angle, bilateral: Secondary | ICD-10-CM | POA: Diagnosis not present

## 2022-02-20 DIAGNOSIS — H43393 Other vitreous opacities, bilateral: Secondary | ICD-10-CM | POA: Diagnosis not present

## 2022-05-13 ENCOUNTER — Other Ambulatory Visit: Payer: Self-pay | Admitting: Urology

## 2022-05-13 DIAGNOSIS — N400 Enlarged prostate without lower urinary tract symptoms: Secondary | ICD-10-CM

## 2022-05-14 ENCOUNTER — Ambulatory Visit
Admission: RE | Admit: 2022-05-14 | Discharge: 2022-05-14 | Disposition: A | Discharge status: Home / Self Care | Payer: Medicare Other | Source: Ambulatory Visit | Attending: Urology | Admitting: Urology

## 2022-05-14 DIAGNOSIS — N400 Enlarged prostate without lower urinary tract symptoms: Secondary | ICD-10-CM

## 2022-05-15 ENCOUNTER — Ambulatory Visit
Admission: RE | Admit: 2022-05-15 | Discharge: 2022-05-15 | Disposition: A | Discharge status: Home / Self Care | Payer: Medicare Other | Source: Ambulatory Visit | Attending: Urology | Admitting: Urology

## 2022-05-16 ENCOUNTER — Ambulatory Visit
Admission: RE | Admit: 2022-05-16 | Discharge: 2022-05-16 | Disposition: A | Discharge status: Home / Self Care | Payer: Medicare Other | Source: Ambulatory Visit | Attending: Urology | Admitting: Urology

## 2022-05-17 ENCOUNTER — Telehealth: Payer: Medicare Other

## 2022-05-18 ENCOUNTER — Other Ambulatory Visit: Payer: Self-pay | Admitting: Interventional Radiology

## 2022-05-18 ENCOUNTER — Ambulatory Visit
Admission: RE | Admit: 2022-05-18 | Discharge: 2022-05-18 | Disposition: A | Discharge status: Home / Self Care | Payer: Medicare Other | Source: Ambulatory Visit | Attending: Urology | Admitting: Urology

## 2022-05-18 DIAGNOSIS — N401 Enlarged prostate with lower urinary tract symptoms: Secondary | ICD-10-CM

## 2022-05-18 HISTORY — PX: IR RADIOLOGIST EVAL & MGMT: IMG5224

## 2022-05-18 NOTE — Consult Note (Signed)
Chief Complaint: Patient was seen in virtual telephone consultation today for benign prostatic hyperplasia with lower urinary tract symptoms.   Referring Physician(s): Berniece Salines, MD  History of Present Illness: Danny Cobb is a 77 y.o. male with history of benign prostatic hyperplasia who presents as a gracious referral from [name] for consideration of prostate artery embolization.  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  He travels frequently, and this has become a major fear and limitation for what he's able to do.  He has not had an event like this in the past 6 months, but several episodes of straining prior to finally being able to void. Additionally, he has significant nocturia, waking at least 3 times per night to urinary.  He also reports frequency throughout the day.  He has decent urinary stream.  He denies any recent urinary tract infections or hematuria.  He hakes tamsulosin 0.4 mg BID.    Recent in office ultrasound (no access to images) reports a 400 g prostate.  He has had prior MRI and prostate biopsy which showed no signs of prostate cancer.  He has gradual elevation in PSA but PSA density low.    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   Past Medical History:  Diagnosis Date   Anxiety    Arthritis    Cataract    right eye   Colon polyps    Elevated PSA    History of kidney stones    over 10 yrs ago   Hypertension 12-20-10   tx. Lotrel   Hypertrophy of prostate without urinary obstruction and other lower urinary tract symptoms (LUTS)    Lipoma of neck October 2012   posterior neck lesion    Other malaise and fatigue    Tubular adenoma 09/2010    Past Surgical History:  Procedure Laterality Date   ANAL RECTAL MANOMETRY N/A 08/06/2019   Procedure:  ANO RECTAL MANOMETRY;  Surgeon: Charlott Rakes, MD;  [facility];  Service: Endoscopy;  Laterality: N/A;   KNEE ARTHROCENTESIS  1989   left knee -scope   MASS EXCISION  12/24/2010   Procedure: EXCISION MASS;  Surgeon: Wilmon Arms. 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  Medications: Prior to Admission medications   Medication Sig Start Date End Date Taking? Authorizing Provider  amLODipine-benazepril (LOTREL) 5-10 MG per capsule Take 1 capsule by mouth daily.    [provider]  cephALEXin (KEFLEX) 500 MG capsule Take 1 capsule (500 mg total) by mouth 3 (three) times daily. 07/31/20   Terrilee Files, MD  ondansetron (ZOFRAN ODT) 4 MG disintegrating tablet Take 1 tablet (4 mg total) by mouth every 8 (eight) hours as needed for nausea or vomiting. 01/25/20   Hall-Potvin, [place], PA-C  prochlorperazine (COMPAZINE) 10 MG tablet Take 1 tablet (10 mg total) by mouth every 6 (six) hours as needed for nausea or vomiting (or headache). 01/26/20   Dione Booze, MD     Family History  Problem Relation Age of Onset   Cancer Mother     Social History   Socioeconomic History   Marital status: Single    Spouse name: Not on file   Number of children: Not on file   Years of education: Not on file   Highest education level: Not on file  Occupational History   Not on file  Tobacco Use   Smoking status: Never   Smokeless tobacco: Never  Vaping Use   Vaping Use: Never used  Substance and Sexual Activity   Alcohol use: Yes    Comment: 1 glass per week   Drug use: No   Sexual activity: Not on file  Other Topics Concern   Not on  file  Social History Narrative   Not on file   Social Determinants of Health   Financial Resource Strain: Not on file  Food Insecurity: Not on file  Transportation Needs: Not on file  Physical Activity: Not on file  Stress: Not on file  Social Connections: Not on file    Review of Systems: A 12 point ROS discussed and pertinent positives are indicated in the HPI above.  All other systems are negative.  Vital Signs: There were no vitals taken for this visit.  No physical examination was performed in lieu of virtual telephone clinic visit.  Imaging: MRI Prostate 01/06/13   7.7 x 6.0 x 4.5 cm = 109 g  Labs:  CBC: No results for input(s): "WBC", "HGB", "HCT", "PLT" in the last 8760 hours.  COAGS: No results for input(s): "INR", "APTT" in the last 8760 hours.  BMP: No results for input(s): "NA", "K", "CL", "CO2", "GLUCOSE", "BUN", "CALCIUM", "CREATININE", "GFRNONAA", "GFRAA" in the last 8760 hours.  Invalid input(s): "CMP"  LIVER FUNCTION TESTS: No results for input(s): "BILITOT", "AST", "ALT", "ALKPHOS", "PROT", "ALBUMIN" in the last 8760 hours.  TUMOR MARKERS: No results for input(s): "AFPTM", "CEA", "CA199", "CHROMGRNA" in the last 8760 hours.  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  He has a massively enlarged prostate, reported at 400 g on recent Urology office ultrasound.    He would be an excellent candidate for prostate artery embolization.  We discussed the periprocedural expectations, technical details of the procedure, and expected outcomes both immediately and long term.  He is amenable and would like to proceed.  -plan for CTA pelvis for procedure planning purposes -plan for prostate artery embolization at [facility] -will plan for prostate MRI and PSA 6  months post procedure   Electronically Signed: [name], MD 05/18/2022, 10:32 AM   I spent a total of  60 Minutes  in virtual telephone clinical consultation, greater than 50% of which was counseling/coordinating care for benign prostatic hyperplasia.

## 2022-05-24 ENCOUNTER — Other Ambulatory Visit (HOSPITAL_COMMUNITY): Payer: Self-pay | Admitting: Interventional Radiology

## 2022-05-24 DIAGNOSIS — N401 Enlarged prostate with lower urinary tract symptoms: Secondary | ICD-10-CM

## 2022-05-27 ENCOUNTER — Ambulatory Visit
Admission: RE | Admit: 2022-05-27 | Discharge: 2022-05-27 | Disposition: A | Discharge status: Home / Self Care | Payer: Medicare Other | Source: Ambulatory Visit | Attending: Interventional Radiology | Admitting: Interventional Radiology

## 2022-05-27 DIAGNOSIS — N401 Enlarged prostate with lower urinary tract symptoms: Secondary | ICD-10-CM

## 2022-05-27 DIAGNOSIS — K5730 Diverticulosis of large intestine without perforation or abscess without bleeding: Secondary | ICD-10-CM | POA: Diagnosis not present

## 2022-05-27 MED ORDER — IOPAMIDOL (ISOVUE-300) INJECTION 61%: 100.0000 mL | Freq: Once | INTRAVENOUS | Status: DC | PRN

## 2022-05-27 MED ORDER — IOPAMIDOL (ISOVUE-370) INJECTION 76%
75.0000 mL | Freq: Once | INTRAVENOUS | Status: AC | PRN
  Administered 2022-05-27: 75 mL via INTRAVENOUS

## 2022-06-05 ENCOUNTER — Other Ambulatory Visit: Payer: Self-pay | Admitting: Student

## 2022-06-05 ENCOUNTER — Other Ambulatory Visit: Payer: Self-pay | Admitting: Internal Medicine

## 2022-06-05 DIAGNOSIS — R338 Other retention of urine: Secondary | ICD-10-CM

## 2022-06-06 ENCOUNTER — Other Ambulatory Visit (HOSPITAL_COMMUNITY): Payer: Self-pay | Admitting: Interventional Radiology

## 2022-06-06 ENCOUNTER — Ambulatory Visit (HOSPITAL_COMMUNITY)
Admission: RE | Admit: 2022-06-06 | Discharge: 2022-06-06 | Disposition: A | Discharge status: Home / Self Care | Payer: Medicare Other | Source: Ambulatory Visit | Attending: Interventional Radiology | Admitting: Interventional Radiology

## 2022-06-06 ENCOUNTER — Other Ambulatory Visit: Payer: Self-pay

## 2022-06-06 DIAGNOSIS — I10 Essential (primary) hypertension: Secondary | ICD-10-CM | POA: Diagnosis not present

## 2022-06-06 DIAGNOSIS — F419 Anxiety disorder, unspecified: Secondary | ICD-10-CM | POA: Insufficient documentation

## 2022-06-06 DIAGNOSIS — N401 Enlarged prostate with lower urinary tract symptoms: Secondary | ICD-10-CM

## 2022-06-06 DIAGNOSIS — R338 Other retention of urine: Secondary | ICD-10-CM

## 2022-06-06 HISTORY — PX: IR CT SPINE LTD: IMG2387

## 2022-06-06 HISTORY — PX: IR ANGIOGRAM SELECTIVE EACH ADDITIONAL VESSEL: IMG667

## 2022-06-06 HISTORY — PX: IR EMBO ARTERIAL NOT HEMORR HEMANG INC GUIDE ROADMAPPING: IMG5448

## 2022-06-06 HISTORY — PX: IR ANGIOGRAM PELVIS SELECTIVE OR SUPRASELECTIVE: IMG661

## 2022-06-06 HISTORY — PX: IR US GUIDE VASC ACCESS LEFT: IMG2389

## 2022-06-06 LAB — CBC WITH DIFFERENTIAL/PLATELET
Abs Immature Granulocytes: 0.02 10*3/uL (ref 0.00–0.07)
Basophils Absolute: 0.1 10*3/uL (ref 0.0–0.1)
Basophils Relative: 1 %
Eosinophils Absolute: 0.2 10*3/uL (ref 0.0–0.5)
Eosinophils Relative: 3 %
HCT: 46 % (ref 39.0–52.0)
Hemoglobin: 15.8 g/dL (ref 13.0–17.0)
Immature Granulocytes: 0 %
Lymphocytes Relative: 26 %
Lymphs Abs: 1.5 10*3/uL (ref 0.7–4.0)
MCH: 31.6 pg (ref 26.0–34.0)
MCHC: 34.3 g/dL (ref 30.0–36.0)
MCV: 92 fL (ref 80.0–100.0)
Monocytes Absolute: 0.5 10*3/uL (ref 0.1–1.0)
Monocytes Relative: 9 %
Neutro Abs: 3.7 10*3/uL (ref 1.7–7.7)
Neutrophils Relative %: 61 %
Platelets: 217 10*3/uL (ref 150–400)
RBC: 5 MIL/uL (ref 4.22–5.81)
RDW: 13.2 % (ref 11.5–15.5)
WBC: 6 10*3/uL (ref 4.0–10.5)
nRBC: 0 % (ref 0.0–0.2)

## 2022-06-06 LAB — BASIC METABOLIC PANEL
Anion gap: 10 (ref 5–15)
BUN: 13 mg/dL (ref 8–23)
CO2: 22 mmol/L (ref 22–32)
Calcium: 9.3 mg/dL (ref 8.9–10.3)
Chloride: 105 mmol/L (ref 98–111)
Creatinine, Ser: 1.05 mg/dL (ref 0.61–1.24)
GFR, Estimated: >60 mL/min (ref >60)
Glucose, Bld: 108 mg/dL — ABNORMAL HIGH (ref 70–99)
Potassium: 3.8 mmol/L (ref 3.5–5.1)
Sodium: 137 mmol/L (ref 135–145)

## 2022-06-06 LAB — PROTIME-INR
INR: 1.1 (ref 0.8–1.2)
Prothrombin Time: 14 seconds (ref 11.4–15.2)

## 2022-06-06 MED ORDER — LIDOCAINE-EPINEPHRINE 1 %-1:100000 IJ SOLN
INTRAMUSCULAR | Status: AC
  Filled 2022-06-06: qty 1

## 2022-06-06 MED ORDER — HEPARIN SODIUM (PORCINE) 1000 UNIT/ML IJ SOLN
INTRAMUSCULAR | Status: AC
  Filled 2022-06-06: qty 10

## 2022-06-06 MED ORDER — LIDOCAINE-PRILOCAINE 2.5-2.5 % EX CREA
TOPICAL_CREAM | Freq: Once | CUTANEOUS | Status: AC
  Filled 2022-06-06: qty 5

## 2022-06-06 MED ORDER — SOLIFENACIN SUCCINATE 5 MG PO TABS: 5.0000 mg | ORAL_TABLET | Freq: Every day | ORAL | 0 refills | Status: DC

## 2022-06-06 MED ORDER — FENTANYL CITRATE (PF) 100 MCG/2ML IJ SOLN
INTRAMUSCULAR | Status: AC
  Filled 2022-06-06: qty 2

## 2022-06-06 MED ORDER — IBUPROFEN 800 MG PO TABS: 800.0000 mg | ORAL_TABLET | Freq: Three times a day (TID) | ORAL | 0 refills | Status: AC

## 2022-06-06 MED ORDER — FENTANYL CITRATE (PF) 100 MCG/2ML IJ SOLN
INTRAMUSCULAR | Status: AC | PRN
  Administered 2022-06-06 (×2): 50 ug via INTRAVENOUS
  Administered 2022-06-06: 25 ug via INTRAVENOUS
  Administered 2022-06-06: 50 ug via INTRAVENOUS
  Administered 2022-06-06: 25 ug via INTRAVENOUS

## 2022-06-06 MED ORDER — PREDNISONE 20 MG PO TABS
20.0000 mg | ORAL_TABLET | Freq: Once | ORAL | Status: AC
  Administered 2022-06-06: 20 mg via ORAL
  Filled 2022-06-06: qty 1

## 2022-06-06 MED ORDER — GELATIN ABSORBABLE 12-7 MM EX MISC
CUTANEOUS | Status: AC
  Filled 2022-06-06: qty 1

## 2022-06-06 MED ORDER — GELATIN ABSORBABLE 12-7 MM EX MISC
1.0000 | Freq: Once | CUTANEOUS | Status: AC
  Administered 2022-06-06: 1

## 2022-06-06 MED ORDER — IOHEXOL 300 MG/ML  SOLN
100.0000 mL | Freq: Once | INTRAMUSCULAR | Status: AC | PRN
  Administered 2022-06-06: 25 mL via INTRA_ARTERIAL

## 2022-06-06 MED ORDER — CIPROFLOXACIN IN D5W 400 MG/200ML IV SOLN
400.0000 mg | INTRAVENOUS | Status: AC
  Administered 2022-06-06: 400 mg via INTRAVENOUS
  Filled 2022-06-06: qty 200

## 2022-06-06 MED ORDER — MIDAZOLAM HCL 2 MG/2ML IJ SOLN
INTRAMUSCULAR | Status: AC | PRN
  Administered 2022-06-06: .5 mg via INTRAVENOUS
  Administered 2022-06-06: 1 mg via INTRAVENOUS
  Administered 2022-06-06 (×2): .5 mg via INTRAVENOUS
  Administered 2022-06-06: 1 mg via INTRAVENOUS
  Administered 2022-06-06: .5 mg via INTRAVENOUS

## 2022-06-06 MED ORDER — NITROGLYCERIN 2 % TD OINT
0.5000 [in_us] | TOPICAL_OINTMENT | Freq: Once | TRANSDERMAL | Status: AC
  Administered 2022-06-06: 0.5 [in_us] via TOPICAL
  Filled 2022-06-06: qty 30

## 2022-06-06 MED ORDER — IOHEXOL 300 MG/ML  SOLN
100.0000 mL | Freq: Once | INTRAMUSCULAR | Status: AC | PRN
  Administered 2022-06-06: 5 mL via INTRA_ARTERIAL

## 2022-06-06 MED ORDER — PHENAZOPYRIDINE HCL 100 MG PO TABS: 95.0000 mg | ORAL_TABLET | Freq: Three times a day (TID) | ORAL | 0 refills | Status: DC

## 2022-06-06 MED ORDER — MIDAZOLAM HCL 2 MG/2ML IJ SOLN
INTRAMUSCULAR | Status: AC
  Filled 2022-06-06: qty 4

## 2022-06-06 MED ORDER — SODIUM CHLORIDE 0.9 % IV SOLN: INTRAVENOUS | Status: DC

## 2022-06-06 MED ORDER — VERAPAMIL HCL 2.5 MG/ML IV SOLN
INTRA_ARTERIAL | Status: AC | PRN
  Administered 2022-06-06: 6 mL via INTRA_ARTERIAL

## 2022-06-06 MED ORDER — LIDOCAINE-EPINEPHRINE 1 %-1:100000 IJ SOLN
20.0000 mL | Freq: Once | INTRAMUSCULAR | Status: AC
  Administered 2022-06-06: 1 mL via INTRADERMAL

## 2022-06-06 MED ORDER — VERAPAMIL HCL 2.5 MG/ML IV SOLN
INTRAVENOUS | Status: AC
  Filled 2022-06-06: qty 2

## 2022-06-06 MED ORDER — CIPROFLOXACIN HCL 500 MG PO TABS: 500.0000 mg | ORAL_TABLET | Freq: Two times a day (BID) | ORAL | 0 refills | Status: AC

## 2022-06-06 MED ORDER — NITROGLYCERIN 1 MG/10 ML FOR IR/CATH LAB
INTRA_ARTERIAL | Status: AC
  Filled 2022-06-06: qty 10

## 2022-06-06 MED ORDER — IOHEXOL 300 MG/ML  SOLN
150.0000 mL | Freq: Once | INTRAMUSCULAR | Status: AC | PRN
  Administered 2022-06-06: 150 mL via INTRA_ARTERIAL

## 2022-06-06 NOTE — Procedures (Signed)
Interventional Radiology Procedure Note  Procedure: Prostate artery embolization  Findings: Please refer to procedural dictation for full description. Bilateral prostate artery embolization.  Coil embolization of right inferior rectal branch for embolic protection.  Left radial artery access, TR band closure.  Complications: None immediate  Estimated Blood Loss: < 5 ml  Recommendations: TR band release protocol. Remove Foley in IR. Discharge home once TR band is released. Void prior to discharge. IR will arrange for 1 month follow up.   Marliss Coots, MD

## 2022-06-06 NOTE — Discharge Instructions (Signed)

## 2022-06-06 NOTE — H&P (Signed)
Chief Complaint: Patient was seen in consultation today for BPH with LUTS at the request of Suttle,Dylan J  Referring Physician(s): [name] Supervising Physician: Marliss Coots  Patient Status: [facility] - Out-pt  History of Present Illness:  Danny Cobb is a 77 y.o. male referred by urology to IR by [name] for treatment of benign prostatic hyperplasia. Pt complains of lower urinary tract symptoms with multiple episodes of acute urinary retention requiring catheterization, nocturia and urinary frequency during waking hours. He consulted via telephone with Dr. Elby Showers 05/18/22 for treatment option with prostate artery embolization procedure. Pt decided to proceed and presents today for PAE.  Pt denies chills, fever, fatigue, SOB, CP, abd pain, N/V, dizziness, HA or weakness.  He endorses urinary frequency, nocturia and history of difficulty urinating.  He is NPO per order.    Past Medical History:  Diagnosis Date   Anxiety    Arthritis    Cataract    right eye   Colon polyps    Elevated PSA    History of kidney stones    over 10 yrs ago   Hypertension 12-20-10   tx. Lotrel   Hypertrophy of prostate without urinary obstruction and other lower urinary tract symptoms (LUTS)    Lipoma of neck October 2012   posterior neck lesion    Other malaise and fatigue    Tubular adenoma 09/2010    Past Surgical History:  Procedure Laterality Date   ANAL RECTAL MANOMETRY N/A 08/06/2019   Procedure: ANO RECTAL MANOMETRY;  Surgeon: Charlott Rakes, MD;  Location: [facility] ENDOSCOPY;  Service: Endoscopy;  Laterality: N/A;   IR RADIOLOGIST EVAL & MGMT  05/18/2022   KNEE ARTHROCENTESIS  1989   left knee -scope   MASS EXCISION  12/24/2010   Procedure: EXCISION MASS;  Surgeon: Wilmon Arms. 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  Medications: Prior to Admission medications   Medication Sig Start Date End Date Taking? Authorizing Provider  amLODipine-benazepril (LOTREL) 5-10 MG per capsule Take 1 capsule by mouth daily.    [provider]  cephALEXin (KEFLEX) 500 MG capsule Take 1 capsule (500 mg total) by mouth 3 (three) times daily. 07/31/20   Terrilee Files, MD  ondansetron (ZOFRAN ODT) 4 MG disintegrating tablet Take 1 tablet (4 mg total) by mouth every 8 (eight) hours as needed for nausea or vomiting. 01/25/20   Hall-Potvin, [place], PA-C  prochlorperazine (COMPAZINE) 10 MG tablet Take 1 tablet (10 mg total) by mouth every 6 (six) hours as needed for nausea or vomiting (or headache). 01/26/20   Dione Booze, MD     Family History  Problem Relation Age of Onset   Cancer Mother     Social History   Socioeconomic History   Marital status: Single    Spouse name: Not on file   Number of children: Not on file   Years of education: Not on file   Highest education level: Not on file  Occupational History   Not on file  Tobacco Use   Smoking status: Never   Smokeless tobacco: Never  Vaping Use   Vaping Use:  Never used  Substance and Sexual Activity   Alcohol use: Yes    Comment: 1 glass per week   Drug use: No   Sexual activity: Not on file  Other Topics Concern   Not on file  Social History Narrative   Not on file   Social Determinants of Health   Financial Resource Strain: Not on file  Food Insecurity: Not on file  Transportation Needs: Not on file  Physical Activity: Not on file  Stress: Not on file  Social Connections: Not on file    Review of Systems: A 12 point ROS discussed and pertinent positives are indicated in the HPI above.  All other systems are negative.  Review of Systems   Constitutional:  Negative for chills, fatigue and fever.  Respiratory:  Negative for shortness of breath.   Cardiovascular:  Negative for chest pain.  Gastrointestinal:  Negative for abdominal pain, nausea and vomiting.  Genitourinary:  Positive for difficulty urinating and frequency.  Neurological:  Negative for dizziness, weakness and headaches.    Vital Signs: BP (!) 147/84   Pulse 63   Temp 97.8 F (36.6 C)   Resp 16   Ht  (1.727 m)   Wt 214 lb (97.1 kg)   SpO2 97%   BMI 32.54 kg/m    Physical Exam Vitals reviewed.  Constitutional:      Appearance: Normal appearance.  HENT:     Head: Normocephalic and atraumatic.     Mouth/Throat:     Mouth: Mucous membranes are dry.     Pharynx: Oropharynx is clear.  Eyes:     Extraocular Movements: Extraocular movements intact.     Pupils: Pupils are equal, round, and reactive to light.  Cardiovascular:     Rate and Rhythm: Normal rate and regular rhythm.     Pulses: Normal pulses.     Heart sounds: Normal heart sounds.  Pulmonary:     Effort: Pulmonary effort is normal. No respiratory distress.     Breath sounds: Normal breath sounds.  Abdominal:     General: Bowel sounds are normal. There is no distension.     Palpations: Abdomen is soft.     Tenderness: There is no abdominal tenderness. There is no guarding.  Musculoskeletal:     Right lower leg: No edema.     Left lower leg: No edema.  Skin:    General: Skin is warm and dry.  Neurological:     Mental Status: He is alert and oriented to person, place, and time.  Psychiatric:        Mood and Affect: Mood normal.        Behavior: Behavior normal.        Thought Content: Thought content normal.        Judgment: Judgment normal.     Imaging: CT ANGIO PELVIS W OR WO CONTRAST  Result Date: 05/28/2022 CLINICAL DATA:  Prostate enlargement, preop planning EXAM: CT ANGIOGRAPHY PELVIS TECHNIQUE: Multidetector CT imaging through the pelvis was performed using the  standard protocol during bolus administration of intravenous contrast. Multiplanar reconstructed images and MIPs were obtained and reviewed to evaluate the vascular anatomy. RADIATION DOSE REDUCTION: This exam was performed according to the departmental dose-optimization program which includes automated exposure control, adjustment of the mA and/or kV according to patient size and/or use of iterative reconstruction technique. CONTRAST:  75mL ISOVUE-370 IOPAMIDOL (ISOVUE-370) INJECTION 76% COMPARISON:  12/27/2004 FINDINGS: VASCULAR Aorta: Visualized infrarenal segment normal. IMA: Patent, origin not visualized. Inflow:  Minimal calcified plaque at the bifurcation of both common iliac arteries. Minimal scattered plaque in the left internal iliac artery. Mild tortuosity. No aneurysm, dissection, or stenosis. Visualized portions of bilateral lower extremity arterial outflow are widely patent. Veins: Common femoral veins and iliac venous system unremarkable. Review of the MIP images confirms the above findings. NON-VASCULAR Urinary bladder partially distended.  Prostate enlargement. Visualized portions of small bowel and colon are nondilated. Scattered distal descending and proximal sigmoid diverticula without adjacent inflammatory change. No adenopathy. Small paraumbilical hernia containing only mesenteric fat. No pelvic ascites. 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 Review of the MIP images confirms the above findings. IMPRESSION: 1. Minimal iliac atherosclerotic plaque. No significant occlusive disease. 2. Descending and sigmoid diverticulosis. Electronically Signed   By: Corlis Leak M.D.   On: 05/28/2022 20:58   IR Radiologist Eval & Mgmt  Result Date: 05/18/2022 EXAM: NEW PATIENT OFFICE VISIT CHIEF COMPLAINT: See Epic note. HISTORY OF PRESENT ILLNESS: See Epic note. REVIEW OF SYSTEMS: See Epic note. PHYSICAL EXAMINATION: See Epic note. ASSESSMENT AND PLAN: See Epic note. Marliss Coots, MD [facility] Electronically Signed   By: Marliss Coots M.D.   On: 05/18/2022 11:18    Labs:  CBC: Recent Labs    06/06/22 1012  WBC 6.0  HGB 15.8  HCT 46.0  PLT 217    COAGS: Recent Labs    06/06/22 1012  INR 1.1    BMP: Recent Labs    06/06/22 1012  NA 137  K 3.8  CL 105  CO2 22  GLUCOSE 108*  BUN 13  CALCIUM 9.3  CREATININE 1.05  GFRNONAA >60    LIVER FUNCTION TESTS: No results for input(s): "BILITOT", "AST", "ALT", "ALKPHOS", "PROT", "ALBUMIN" in the last 8760 hours.  TUMOR MARKERS: No results for input(s): "AFPTM", "CEA", "CA199", "CHROMGRNA" in the last 8760 hours.  Assessment and Plan:  77 yo male with PMHx significant for HTN, anxiety, BPH and LUTS presents to IR for prostate artery embolization.   Pt resting on stretcher.  He is A&O, calm and pleasant.  He is in no distress.   The Risks and benefits of embolization were discussed with the patient including, but not limited to bleeding, infection, vascular injury, post operative pain, or contrast induced renal failure.  This procedure involves the use of X-rays and because of the nature of the planned procedure, it is possible that we will have prolonged use of X-ray fluoroscopy.  Potential radiation risks to you include (but are not limited to) the following: - A slightly elevated risk for cancer several years later in life. This risk is typically less than 0.5% percent. This risk is low in comparison to the normal incidence of human cancer, which is 33% for women and 50% for men according to the [employer] Society. - Radiation induced injury can include skin redness, resembling a rash, tissue breakdown / ulcers and hair loss (which can be temporary or permanent).   The likelihood of either of these occurring depends on the difficulty of the procedure and whether you are sensitive to radiation due to previous procedures, disease, or genetic  conditions.   IF your procedure requires a prolonged use of radiation, you will be notified and given written instructions for further action.  It is your responsibility to monitor the irradiated area for the 2 weeks following the procedure and to notify your physician if you are concerned that you have suffered a radiation induced  injury.    All of the patient's questions were answered, patient is agreeable to proceed. Consent signed and in chart.  Thank you for this interesting consult.  I greatly enjoyed meeting [employer] and look forward to participating in their care.  A copy of this report was sent to the requesting provider on this date.  Electronically Signed: Shon Hough, NP 06/06/2022, 11:33 AM   I spent a total of 20 minutes in face to face in clinical consultation, greater than 50% of which was counseling/coordinating care for BPH with LUTS.

## 2022-06-08 ENCOUNTER — Emergency Department (HOSPITAL_BASED_OUTPATIENT_CLINIC_OR_DEPARTMENT_OTHER)
Admission: EM | Admit: 2022-06-08 | Discharge: 2022-06-08 | Disposition: A | Discharge status: Home / Self Care | Payer: Medicare Other | Attending: Emergency Medicine | Admitting: Emergency Medicine

## 2022-06-08 ENCOUNTER — Other Ambulatory Visit: Payer: Self-pay

## 2022-06-08 ENCOUNTER — Encounter (HOSPITAL_BASED_OUTPATIENT_CLINIC_OR_DEPARTMENT_OTHER): Payer: Self-pay | Admitting: Emergency Medicine

## 2022-06-08 DIAGNOSIS — R339 Retention of urine, unspecified: Secondary | ICD-10-CM | POA: Insufficient documentation

## 2022-06-08 LAB — URINALYSIS, ROUTINE W REFLEX MICROSCOPIC
Bilirubin Urine: NEGATIVE
Glucose, UA: NEGATIVE mg/dL
Ketones, ur: 80 mg/dL — AB
Nitrite: NEGATIVE
Protein, ur: 100 mg/dL — AB
Specific Gravity, Urine: 1.025 (ref 1.005–1.030)
pH: 7 (ref 5.0–8.0)

## 2022-06-08 LAB — URINALYSIS, MICROSCOPIC (REFLEX)

## 2022-06-08 MED ORDER — PHENAZOPYRIDINE HCL 100 MG PO TABS: 95.0000 mg | ORAL_TABLET | Freq: Three times a day (TID) | ORAL | 0 refills | Status: AC

## 2022-06-08 NOTE — ED Provider Notes (Signed)
[place] EMERGENCY DEPARTMENT AT [facility] Provider Note   CSN: [id number] Arrival date & time: 06/08/22  1741     History  Chief Complaint  Patient presents with   Urinary Retention    Danny Cobb is a 77 y.o. male.  With a past medical history of BPH presenting today with urinary retention.  Patient had a prostate artery embolization 2 days ago.  He has been unable to urinate voluntarily since.  Reports last night he was having severe pain so he catheterized himself.  He did not measure much urine came out but it was "a lot."  Says that he continues to have bladder discomfort and has not urinated since his catheterization at home last night.  Follows with [name]  HPI     Home Medications Prior to Admission medications   Medication Sig Start Date End Date Taking? Authorizing Provider  amLODipine-benazepril (LOTREL) 5-10 MG per capsule Take 1 capsule by mouth daily.    [provider]  cephALEXin (KEFLEX) 500 MG capsule Take 1 capsule (500 mg total) by mouth 3 (three) times daily. 07/31/20   Terrilee Files, MD  ciprofloxacin (CIPRO) 500 MG tablet Take 1 tablet (500 mg total) by mouth 2 (two) times daily for 7 days. 06/06/22 06/13/22  Shon Hough, NP  ibuprofen (ADVIL) 800 MG tablet Take 1 tablet (800 mg total) by mouth every 8 (eight) hours for 7 days. 06/06/22 06/13/22  Shon Hough, NP  ondansetron (ZOFRAN ODT) 4 MG disintegrating tablet Take 1 tablet (4 mg total) by mouth every 8 (eight) hours as needed for nausea or vomiting. 01/25/20   Hall-Potvin, [place], PA-C  phenazopyridine (PYRIDIUM) 100 MG tablet Take 1 tablet (100 mg total) by mouth 3 (three) times daily for 7 days. 06/06/22 06/13/22  Shon Hough, NP  prochlorperazine (COMPAZINE) 10 MG tablet Take 1 tablet (10 mg total) by mouth every 6 (six) hours as needed for nausea or vomiting (or headache). 01/26/20   Dione Booze, MD  solifenacin (VESICARE) 5 MG tablet Take 1 tablet (5 mg total)  by mouth daily. 06/06/22   Shon Hough, NP  tamsulosin (FLOMAX) 0.4 MG CAPS capsule Take 0.4 mg by mouth at bedtime.    [provider]      Allergies    Patient has no known allergies.    Review of Systems   Review of Systems  Physical Exam Updated Vital Signs BP (!) 127/97   Pulse (!) 150   Temp 98.4 F (36.9 C) (Oral)   Resp 20   SpO2 96%  Physical Exam Vitals and nursing note reviewed.  Constitutional:      Appearance: Normal appearance.  HENT:     Head: Normocephalic and atraumatic.  Eyes:     General: No scleral icterus.    Conjunctiva/sclera: Conjunctivae normal.  Pulmonary:     Effort: Pulmonary effort is normal. No respiratory distress.  Abdominal:     General: Abdomen is flat.     Palpations: Abdomen is soft.     Tenderness: There is abdominal tenderness (Suprapubic).  Skin:    Findings: No rash.  Neurological:     Mental Status: He is alert.  Psychiatric:        Mood and Affect: Mood normal.     ED Results / Procedures / Treatments   Labs (all labs ordered are listed, but only abnormal results are displayed) Labs Reviewed - No data to display  EKG None  Radiology  No results found.  Procedures Procedures   Medications Ordered in ED Medications - No data to display  ED Course/ Medical Decision Making/ A&P                             Medical Decision Making Amount and/or Complexity of Data Reviewed Labs: ordered.  Risk Prescription drug management.   77 year old male presenting today due to urinary retention.   Past Medical History / Co-morbidities / Social History: BPH.  Prostatic artery embolization 2 days ago.  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  Plan is to follow-up with IR in 1 month.  No noted follow-up with urology   Physical Exam: Pertinent physical exam findings include Mild suprapubic  tenderness   Imaging Studies: Bedside bladder scan 290  Cardiac Monitoring:   Consultations Obtained: I spoke with [name] with urology  MDM/Disposition: This is a 77 year old male who presented today with urinary retention.  Had a prostatic artery embolization 2 days ago.  He has a history of BPH for which he had this embolization.  He reported a self-catheterization last night and he is unsure how much urine came out but that it was "a lot."  He is unable to void in the emergency department.  290 on bladder scan.  Foley catheter was placed and patient will follow-up with [name] with urology.   Friend at bedside is requesting prescription of Pyridium.  They were unable to pick this up because the [facility] is out of this dose.  She was provided with a printed prescription  Final Clinical Impression(s) / ED Diagnoses Final diagnoses:  Urinary retention    Rx / DC Orders ED Discharge Orders          Ordered    phenazopyridine (PYRIDIUM) 100 MG tablet  3 times daily        06/08/22 2013           Results and diagnoses were explained to the patient. Return precautions discussed in full. Patient had no additional questions and expressed complete understanding.   This chart was dictated using voice recognition software.  Despite best efforts to proofread,  errors can occur which can change the documentation meaning.    Woodroe Chen 06/08/22 2015    Lonell Grandchild, MD 06/09/22 1159

## 2022-06-08 NOTE — Discharge Instructions (Addendum)
Please call the urology office on Monday for an appointment.  I printed a prescription for your Pyridium so that you may take it to any pharmacy.  Do not hesitate to return with any recurring or worsening symptoms

## 2022-06-08 NOTE — ED Notes (Signed)
Bladder scan for 

## 2022-06-08 NOTE — ED Triage Notes (Signed)
Pt had prostate procedure on Thurs; reports he has not voided since last night

## 2022-06-08 NOTE — ED Notes (Signed)
Urinary bag switched to leg bag for discharge and home use.

## 2022-06-11 ENCOUNTER — Other Ambulatory Visit: Payer: Self-pay | Admitting: Interventional Radiology

## 2022-06-11 DIAGNOSIS — R338 Other retention of urine: Secondary | ICD-10-CM | POA: Diagnosis not present

## 2022-06-11 DIAGNOSIS — N401 Enlarged prostate with lower urinary tract symptoms: Secondary | ICD-10-CM

## 2022-06-12 ENCOUNTER — Other Ambulatory Visit: Payer: Self-pay

## 2022-06-12 ENCOUNTER — Emergency Department (HOSPITAL_BASED_OUTPATIENT_CLINIC_OR_DEPARTMENT_OTHER)
Admission: EM | Admit: 2022-06-12 | Discharge: 2022-06-12 | Disposition: A | Discharge status: Home / Self Care | Payer: Medicare Other | Attending: Emergency Medicine | Admitting: Emergency Medicine

## 2022-06-12 ENCOUNTER — Encounter (HOSPITAL_BASED_OUTPATIENT_CLINIC_OR_DEPARTMENT_OTHER): Payer: Self-pay

## 2022-06-12 DIAGNOSIS — E876 Hypokalemia: Secondary | ICD-10-CM | POA: Insufficient documentation

## 2022-06-12 DIAGNOSIS — R339 Retention of urine, unspecified: Secondary | ICD-10-CM | POA: Diagnosis not present

## 2022-06-12 DIAGNOSIS — Z79899 Other long term (current) drug therapy: Secondary | ICD-10-CM | POA: Insufficient documentation

## 2022-06-12 DIAGNOSIS — Y732 Prosthetic and other implants, materials and accessory gastroenterology and urology devices associated with adverse incidents: Secondary | ICD-10-CM | POA: Insufficient documentation

## 2022-06-12 DIAGNOSIS — N179 Acute kidney failure, unspecified: Secondary | ICD-10-CM | POA: Diagnosis not present

## 2022-06-12 DIAGNOSIS — I10 Essential (primary) hypertension: Secondary | ICD-10-CM | POA: Diagnosis not present

## 2022-06-12 DIAGNOSIS — T83098A Other mechanical complication of other indwelling urethral catheter, initial encounter: Secondary | ICD-10-CM | POA: Diagnosis not present

## 2022-06-12 DIAGNOSIS — T839XXA Unspecified complication of genitourinary prosthetic device, implant and graft, initial encounter: Secondary | ICD-10-CM

## 2022-06-12 DIAGNOSIS — T83091A Other mechanical complication of indwelling urethral catheter, initial encounter: Secondary | ICD-10-CM | POA: Diagnosis not present

## 2022-06-12 LAB — BASIC METABOLIC PANEL
Anion gap: 9 (ref 5–15)
BUN: 36 mg/dL — ABNORMAL HIGH (ref 8–23)
CO2: 23 mmol/L (ref 22–32)
Calcium: 8.8 mg/dL — ABNORMAL LOW (ref 8.9–10.3)
Chloride: 106 mmol/L (ref 98–111)
Creatinine, Ser: 1.95 mg/dL — ABNORMAL HIGH (ref 0.61–1.24)
GFR, Estimated: 35 mL/min — ABNORMAL LOW (ref >60)
Glucose, Bld: 132 mg/dL — ABNORMAL HIGH (ref 70–99)
Potassium: 3.2 mmol/L — ABNORMAL LOW (ref 3.5–5.1)
Sodium: 138 mmol/L (ref 135–145)

## 2022-06-12 NOTE — ED Provider Notes (Addendum)
[facility] Provider Note   CSN: [id number] Arrival date & time: 06/12/22  0442     History  Chief Complaint  Patient presents with   catheter issue    Danny Cobb is a 77 y.o. male.  The history is provided by the patient.  Illness Location:  Bladder Quality:  Catheter not working tonight Severity:  Moderate Onset quality:  Gradual Duration: hours. Timing:  Constant Progression:  Worsening Chronicity:  Recurrent Context:  Recent surgery with retention and catheter placed Relieved by:  Nothing Worsened by:  Nothing Ineffective treatments:  None Associated symptoms: no fever, no nausea and no vomiting   Risk factors:  Post surgery with urology Patient with prostate obstruction presents with foley not working.      Past Medical History:  Diagnosis Date   Anxiety    Arthritis    Cataract    right eye   Colon polyps    Elevated PSA    History of kidney stones    over 10 yrs ago   Hypertension 12-20-10   tx. Lotrel   Hypertrophy of prostate without urinary obstruction and other lower urinary tract symptoms (LUTS)    Lipoma of neck October 2012   posterior neck lesion    Other malaise and fatigue    Tubular adenoma 09/2010     Home Medications Prior to Admission medications   Medication Sig Start Date End Date Taking? Authorizing Provider  amLODipine-benazepril (LOTREL) 5-10 MG per capsule Take 1 capsule by mouth daily.    [provider]  cephALEXin (KEFLEX) 500 MG capsule Take 1 capsule (500 mg total) by mouth 3 (three) times daily. 07/31/20   Terrilee Files, MD  ciprofloxacin (CIPRO) 500 MG tablet Take 1 tablet (500 mg total) by mouth 2 (two) times daily for 7 days. 06/06/22 06/13/22  Shon Hough, NP  ibuprofen (ADVIL) 800 MG tablet Take 1 tablet (800 mg total) by mouth every 8 (eight) hours for 7 days. 06/06/22 06/13/22  Shon Hough, NP  ondansetron (ZOFRAN ODT) 4 MG disintegrating tablet Take 1  tablet (4 mg total) by mouth every 8 (eight) hours as needed for nausea or vomiting. 01/25/20   Hall-Potvin, [place], PA-C  phenazopyridine (PYRIDIUM) 100 MG tablet Take 1 tablet (100 mg total) by mouth 3 (three) times daily for 7 days. 06/08/22 06/15/22  Redwine, Madison A, PA-C  prochlorperazine (COMPAZINE) 10 MG tablet Take 1 tablet (10 mg total) by mouth every 6 (six) hours as needed for nausea or vomiting (or headache). 01/26/20   Dione Booze, MD  solifenacin (VESICARE) 5 MG tablet Take 1 tablet (5 mg total) by mouth daily. 06/06/22   Shon Hough, NP  tamsulosin (FLOMAX) 0.4 MG CAPS capsule Take 0.4 mg by mouth at bedtime.    [provider]      Allergies    Patient has no known allergies.    Review of Systems   Review of Systems  Constitutional:  Negative for fever.  Gastrointestinal:  Negative for nausea and vomiting.  Genitourinary:  Positive for difficulty urinating. Negative for dysuria.  All other systems reviewed and are negative.   Physical Exam Updated Vital Signs BP (!) 170/115 (BP Location: Left Arm)   Pulse (!) 105   Temp 97.7 F (36.5 C) (Oral)   Resp 18   Ht 5\' 8"  (1.727 m)   Wt 96.2 kg   SpO2 97%   BMI 32.23 kg/m  Physical Exam  Vitals and nursing note reviewed.  Constitutional:      General: He is not in acute distress.    Appearance: Normal appearance. He is well-developed. He is not diaphoretic.  HENT:     Head: Normocephalic and atraumatic.     Nose: Nose normal.  Eyes:     Conjunctiva/sclera: Conjunctivae normal.     Pupils: Pupils are equal, round, and reactive to light.  Cardiovascular:     Rate and Rhythm: Normal rate and regular rhythm.     Pulses: Normal pulses.     Heart sounds: Normal heart sounds.  Pulmonary:     Effort: Pulmonary effort is normal.     Breath sounds: Normal breath sounds. No wheezing or rales.  Abdominal:     General: Bowel sounds are normal. There is distension.     Palpations: Abdomen is soft.      Tenderness: There is no abdominal tenderness. There is no guarding or rebound.  Musculoskeletal:        General: Normal range of motion.     Cervical back: Normal range of motion and neck supple.  Skin:    General: Skin is warm and dry.     Capillary Refill: Capillary refill takes less than 2 seconds.  Neurological:     General: No focal deficit present.     Mental Status: He is alert and oriented to person, place, and time.     Deep Tendon Reflexes: Reflexes normal.  Psychiatric:        Mood and Affect: Mood normal.        Behavior: Behavior normal.     ED Results / Procedures / Treatments   Labs (all labs ordered are listed, but only abnormal results are displayed) Results for orders placed or performed during the hospital encounter of 06/12/22  Basic metabolic panel  Result Value Ref Range   Sodium 138 135 - 145 mmol/L   Potassium 3.2 (L) 3.5 - 5.1 mmol/L   Chloride 106 98 - 111 mmol/L   CO2 23 22 - 32 mmol/L   Glucose, Bld 132 (H) 70 - 99 mg/dL   BUN 36 (H) 8 - 23 mg/dL   Creatinine, Ser 0.98 (H) 0.61 - 1.24 mg/dL   Calcium 8.8 (L) 8.9 - 10.3 mg/dL   GFR, Estimated 35 (L) >60 mL/min   Anion gap 9 5 - 15   IR EMBO ARTERIAL NOT HEMORR HEMANG INC GUIDE ROADMAPPING  Result Date: 06/07/2022 INDICATION: 77 year old male with history of benign prostatic hyperplasia with the lower urinary tract symptoms including episodes of acute urinary retention requiring catheterization and nocturia. EXAM: 1. Ultrasound-guided vascular access of the left radial artery 2. Selective catheterization angiography of the bilateral common iliac, internal iliac, and prostatic arteries 3. Cone beam CT 4. Coil embolization of right inferior rectal artery 5. Bilateral prostate artery embolization MEDICATIONS: Ciprofloxacin 400 mg IV. The antibiotic was administered within 1 hour of the procedure ANESTHESIA/SEDATION: Moderate (conscious) sedation was employed during this procedure. A total of Versed 4 mg and  Fentanyl 150 mcg was administered intravenously. Moderate Sedation Time: 180 minutes. The patient's level of consciousness and vital signs were monitored continuously by radiology nursing throughout the procedure under my direct supervision. CONTRAST:  OMNIPAQUE IOHEXOL 300 MG/ML SOLN, 25mL OMNIPAQUE IOHEXOL 300 MG/ML SOLN, 5mL OMNIPAQUE IOHEXOL 300 MG/ML SOLN FLUOROSCOPY: Radiation Exposure Index (as provided by the fluoroscopic device): 3,963 mGy Kerma COMPLICATIONS: None immediate. PROCEDURE: Informed consent was obtained from the patient following explanation of the  procedure, risks, benefits and alternatives. The patient understands, agrees and consents for the procedure. All questions were addressed. A time out was performed prior to the initiation of the procedure. Maximal barrier sterile technique utilized including caps, mask, sterile gowns, sterile gloves, large sterile drape, hand hygiene, and Betadine prep. The left wrist was prepped and draped in standard fashion. Pulse oximeter was attached to the left thumb. Tora Perches test was performed, grade A. The left radial artery measured 0.3 cm in diameter. Subdermal Local anesthesia was provided at the planned needle entry site with 1% lidocaine. A small skin nick was made. Under direct ultrasound visualization, the left radial artery was punctured with a 21 gauge micropuncture needle. A permanent image was captured and stored in the record. A microwire was placed and exchanged for a 4/5 French slender sheath. The sheath was flushed followed by installation of standard radial cocktail. Under direct fluoroscopic guidance, a 5 French MG2 catheter and Bentson wire were directed to the level of the aortic arch. There was difficulty in cannulating the descending thoracic aorta, therefore the catheter was exchanged over a Wholey wire for a pigtail catheter. Through the pigtail catheter, a Bentson wire was directed into the descending thoracic aorta and abdominal  aorta. The pigtail catheter was then exchanged for the MG2 catheter. The catheter was then used to select the right internal iliac artery. Internal iliac artery angiogram was performed which demonstrated standard branching configuration of the posterior and anterior divisions with identification of the prostatic artery arising from the proximal anterior division. A 1.9 French lantern microcatheter and fathom 14 wire were then used to select the prostatic vesicular branch. Angiogram of the prostatic a saccular branch artery was performed which demonstrated superior branches supplying the urinary bladder and inferior branch identified as the inferior rectal artery. Cone beam CT was performed which confirmed this anatomic distribution of these vessels. There is shared supply of the right hemi prostate from the right inferior rectal artery. Therefore, the right inferior rectal artery was selected distally and coil embolization was performed with 3, 2 mm 018 inch Azur detachable coils. Additionally, a small volume of Gel-Foam slurry was administered for additional embolic protection. The catheter was then retracted and the right prostatic artery was selected. Additional cone beam CT was performed to confirm location in the right hemi prostate. Particle embolization was then performed with dilute solution of 400 micron Hydropearls until stasis was achieved. The microcatheter was then removed. The base catheter was then retracted under fluoroscopic guidance and used to select the left internal iliac artery. Left internal iliac angiogram was performed which demonstrated standard branching configuration of the posterior and anterior divisions with a prominent prostatic artery arising from the proximal anterior division. The 1.9 French lantern microcatheter and fathom 14 wire were then used to select the left prostatic artery. Prostatic artery angiogram demonstrated dominant supply of the left hemi prostate without evidence  of non target branches. Cone beam CT was performed which again demonstrated dominant left hemi prostatic supply without evidence of nontarget branches. Particle embolization was then performed of the left prostatic artery with 400 micrometer Hydropearls until stasis was achieved. The catheter was retracted into the proximal prostatic artery and completion left prostatic angiogram was performed which demonstrated adequate embolization. The catheters were removed. A TR band was applied in insufflated. The sheath was removed. The patient tolerated the procedure well without immediate complication and was transferred to the recovery area in good condition. IMPRESSION: 1. Prostatomegaly with left prostatic artery dominant  supply to the prostate gland. 2. Coil embolization of the right inferior rectal artery secondary to showed supply with a right prostatic artery for embolic protection purposes. 3. Technically successful particle embolization of the bilateral prostatic arteries. Marliss Coots, MD [facility] Electronically Signed   By: Marliss Coots M.D.   On: 06/07/2022 07:35   IR US Guide Vasc Access Left  Result Date: 06/07/2022 INDICATION: 77 year old male with history of benign prostatic hyperplasia with the lower urinary tract symptoms including episodes of acute urinary retention requiring catheterization and nocturia. EXAM: 1. Ultrasound-guided vascular access of the left radial artery 2. Selective catheterization angiography of the bilateral common iliac, internal iliac, and prostatic arteries 3. Cone beam CT 4. Coil embolization of right inferior rectal artery 5. Bilateral prostate artery embolization MEDICATIONS: Ciprofloxacin 400 mg IV. The antibiotic was administered within 1 hour of the procedure ANESTHESIA/SEDATION: Moderate (conscious) sedation was employed during this procedure. A total of Versed 4 mg and Fentanyl 150 mcg was administered  intravenously. Moderate Sedation Time: 180 minutes. The patient's level of consciousness and vital signs were monitored continuously by radiology nursing throughout the procedure under my direct supervision. CONTRAST:  OMNIPAQUE IOHEXOL 300 MG/ML SOLN, 25mL OMNIPAQUE IOHEXOL 300 MG/ML SOLN, 5mL OMNIPAQUE IOHEXOL 300 MG/ML SOLN FLUOROSCOPY: Radiation Exposure Index (as provided by the fluoroscopic device): 3,963 mGy Kerma COMPLICATIONS: None immediate. PROCEDURE: Informed consent was obtained from the patient following explanation of the procedure, risks, benefits and alternatives. The patient understands, agrees and consents for the procedure. All questions were addressed. A time out was performed prior to the initiation of the procedure. Maximal barrier sterile technique utilized including caps, mask, sterile gowns, sterile gloves, large sterile drape, hand hygiene, and Betadine prep. The left wrist was prepped and draped in standard fashion. Pulse oximeter was attached to the left thumb. Tora Perches test was performed, grade A. The left radial artery measured 0.3 cm in diameter. Subdermal Local anesthesia was provided at the planned needle entry site with 1% lidocaine. A small skin nick was made. Under direct ultrasound visualization, the left radial artery was punctured with a 21 gauge micropuncture needle. A permanent image was captured and stored in the record. A microwire was placed and exchanged for a 4/5 French slender sheath. The sheath was flushed followed by installation of standard radial cocktail. Under direct fluoroscopic guidance, a 5 French MG2 catheter and Bentson wire were directed to the level of the aortic arch. There was difficulty in cannulating the descending thoracic aorta, therefore the catheter was exchanged over a Wholey wire for a pigtail catheter. Through the pigtail catheter, a Bentson wire was directed into the descending thoracic aorta and abdominal aorta. The pigtail catheter was  then exchanged for the MG2 catheter. The catheter was then used to select the right internal iliac artery. Internal iliac artery angiogram was performed which demonstrated standard branching configuration of the posterior and anterior divisions with identification of the prostatic artery arising from the proximal anterior division. A 1.9 French lantern microcatheter and fathom 14 wire were then used to select the prostatic vesicular branch. Angiogram of the prostatic a saccular branch artery was performed which demonstrated superior branches supplying the urinary bladder and inferior branch identified as the inferior rectal artery. Cone beam CT was performed which confirmed this anatomic distribution of these vessels. There is shared supply of the right hemi prostate from the right inferior rectal artery. Therefore, the right inferior rectal artery was selected distally and coil embolization  was performed with 3, 2 mm 018 inch Azur detachable coils. Additionally, a small volume of Gel-Foam slurry was administered for additional embolic protection. The catheter was then retracted and the right prostatic artery was selected. Additional cone beam CT was performed to confirm location in the right hemi prostate. Particle embolization was then performed with dilute solution of 400 micron Hydropearls until stasis was achieved. The microcatheter was then removed. The base catheter was then retracted under fluoroscopic guidance and used to select the left internal iliac artery. Left internal iliac angiogram was performed which demonstrated standard branching configuration of the posterior and anterior divisions with a prominent prostatic artery arising from the proximal anterior division. The 1.9 French lantern microcatheter and fathom 14 wire were then used to select the left prostatic artery. Prostatic artery angiogram demonstrated dominant supply of the left hemi prostate without evidence of non target branches. Cone beam  CT was performed which again demonstrated dominant left hemi prostatic supply without evidence of nontarget branches. Particle embolization was then performed of the left prostatic artery with 400 micrometer Hydropearls until stasis was achieved. The catheter was retracted into the proximal prostatic artery and completion left prostatic angiogram was performed which demonstrated adequate embolization. The catheters were removed. A TR band was applied in insufflated. The sheath was removed. The patient tolerated the procedure well without immediate complication and was transferred to the recovery area in good condition. IMPRESSION: 1. Prostatomegaly with left prostatic artery dominant supply to the prostate gland. 2. Coil embolization of the right inferior rectal artery secondary to showed supply with a right prostatic artery for embolic protection purposes. 3. Technically successful particle embolization of the bilateral prostatic arteries. Marliss Coots, MD [facility] Electronically Signed   By: Marliss Coots M.D.   On: 06/07/2022 07:35   IR Angiogram Pelvis Selective Or Supraselective  Result Date: 06/07/2022 INDICATION: 77 year old male with history of benign prostatic hyperplasia with the lower urinary tract symptoms including episodes of acute urinary retention requiring catheterization and nocturia. EXAM: 1. Ultrasound-guided vascular access of the left radial artery 2. Selective catheterization angiography of the bilateral common iliac, internal iliac, and prostatic arteries 3. Cone beam CT 4. Coil embolization of right inferior rectal artery 5. Bilateral prostate artery embolization MEDICATIONS: Ciprofloxacin 400 mg IV. The antibiotic was administered within 1 hour of the procedure ANESTHESIA/SEDATION: Moderate (conscious) sedation was employed during this procedure. A total of Versed 4 mg and Fentanyl 150 mcg was administered intravenously.  Moderate Sedation Time: 180 minutes. The patient's level of consciousness and vital signs were monitored continuously by radiology nursing throughout the procedure under my direct supervision. CONTRAST:  OMNIPAQUE IOHEXOL 300 MG/ML SOLN, 25mL OMNIPAQUE IOHEXOL 300 MG/ML SOLN, 5mL OMNIPAQUE IOHEXOL 300 MG/ML SOLN FLUOROSCOPY: Radiation Exposure Index (as provided by the fluoroscopic device): 3,963 mGy Kerma COMPLICATIONS: None immediate. PROCEDURE: Informed consent was obtained from the patient following explanation of the procedure, risks, benefits and alternatives. The patient understands, agrees and consents for the procedure. All questions were addressed. A time out was performed prior to the initiation of the procedure. Maximal barrier sterile technique utilized including caps, mask, sterile gowns, sterile gloves, large sterile drape, hand hygiene, and Betadine prep. The left wrist was prepped and draped in standard fashion. Pulse oximeter was attached to the left thumb. Tora Perches test was performed, grade A. The left radial artery measured 0.3 cm in diameter. Subdermal Local anesthesia was provided at the planned needle entry site with 1% lidocaine. A  small skin nick was made. Under direct ultrasound visualization, the left radial artery was punctured with a 21 gauge micropuncture needle. A permanent image was captured and stored in the record. A microwire was placed and exchanged for a 4/5 French slender sheath. The sheath was flushed followed by installation of standard radial cocktail. Under direct fluoroscopic guidance, a 5 French MG2 catheter and Bentson wire were directed to the level of the aortic arch. There was difficulty in cannulating the descending thoracic aorta, therefore the catheter was exchanged over a Wholey wire for a pigtail catheter. Through the pigtail catheter, a Bentson wire was directed into the descending thoracic aorta and abdominal aorta. The pigtail catheter was then exchanged  for the MG2 catheter. The catheter was then used to select the right internal iliac artery. Internal iliac artery angiogram was performed which demonstrated standard branching configuration of the posterior and anterior divisions with identification of the prostatic artery arising from the proximal anterior division. A 1.9 French lantern microcatheter and fathom 14 wire were then used to select the prostatic vesicular branch. Angiogram of the prostatic a saccular branch artery was performed which demonstrated superior branches supplying the urinary bladder and inferior branch identified as the inferior rectal artery. Cone beam CT was performed which confirmed this anatomic distribution of these vessels. There is shared supply of the right hemi prostate from the right inferior rectal artery. Therefore, the right inferior rectal artery was selected distally and coil embolization was performed with 3, 2 mm 018 inch Azur detachable coils. Additionally, a small volume of Gel-Foam slurry was administered for additional embolic protection. The catheter was then retracted and the right prostatic artery was selected. Additional cone beam CT was performed to confirm location in the right hemi prostate. Particle embolization was then performed with dilute solution of 400 micron Hydropearls until stasis was achieved. The microcatheter was then removed. The base catheter was then retracted under fluoroscopic guidance and used to select the left internal iliac artery. Left internal iliac angiogram was performed which demonstrated standard branching configuration of the posterior and anterior divisions with a prominent prostatic artery arising from the proximal anterior division. The 1.9 French lantern microcatheter and fathom 14 wire were then used to select the left prostatic artery. Prostatic artery angiogram demonstrated dominant supply of the left hemi prostate without evidence of non target branches. Cone beam CT was  performed which again demonstrated dominant left hemi prostatic supply without evidence of nontarget branches. Particle embolization was then performed of the left prostatic artery with 400 micrometer Hydropearls until stasis was achieved. The catheter was retracted into the proximal prostatic artery and completion left prostatic angiogram was performed which demonstrated adequate embolization. The catheters were removed. A TR band was applied in insufflated. The sheath was removed. The patient tolerated the procedure well without immediate complication and was transferred to the recovery area in good condition. IMPRESSION: 1. Prostatomegaly with left prostatic artery dominant supply to the prostate gland. 2. Coil embolization of the right inferior rectal artery secondary to showed supply with a right prostatic artery for embolic protection purposes. 3. Technically successful particle embolization of the bilateral prostatic arteries. Marliss Coots, MD [facility] Electronically Signed   By: Marliss Coots M.D.   On: 06/07/2022 07:35   IR Angiogram Selective Each Additional Vessel  Result Date: 06/07/2022 INDICATION: 77 year old male with history of benign prostatic hyperplasia with the lower urinary tract symptoms including episodes of acute urinary retention requiring catheterization and nocturia.  EXAM: 1. Ultrasound-guided vascular access of the left radial artery 2. Selective catheterization angiography of the bilateral common iliac, internal iliac, and prostatic arteries 3. Cone beam CT 4. Coil embolization of right inferior rectal artery 5. Bilateral prostate artery embolization MEDICATIONS: Ciprofloxacin 400 mg IV. The antibiotic was administered within 1 hour of the procedure ANESTHESIA/SEDATION: Moderate (conscious) sedation was employed during this procedure. A total of Versed 4 mg and Fentanyl 150 mcg was administered intravenously. Moderate  Sedation Time: 180 minutes. The patient's level of consciousness and vital signs were monitored continuously by radiology nursing throughout the procedure under my direct supervision. CONTRAST:  OMNIPAQUE IOHEXOL 300 MG/ML SOLN, 25mL OMNIPAQUE IOHEXOL 300 MG/ML SOLN, 5mL OMNIPAQUE IOHEXOL 300 MG/ML SOLN FLUOROSCOPY: Radiation Exposure Index (as provided by the fluoroscopic device): 3,963 mGy Kerma COMPLICATIONS: None immediate. PROCEDURE: Informed consent was obtained from the patient following explanation of the procedure, risks, benefits and alternatives. The patient understands, agrees and consents for the procedure. All questions were addressed. A time out was performed prior to the initiation of the procedure. Maximal barrier sterile technique utilized including caps, mask, sterile gowns, sterile gloves, large sterile drape, hand hygiene, and Betadine prep. The left wrist was prepped and draped in standard fashion. Pulse oximeter was attached to the left thumb. Tora Perches test was performed, grade A. The left radial artery measured 0.3 cm in diameter. Subdermal Local anesthesia was provided at the planned needle entry site with 1% lidocaine. A small skin nick was made. Under direct ultrasound visualization, the left radial artery was punctured with a 21 gauge micropuncture needle. A permanent image was captured and stored in the record. A microwire was placed and exchanged for a 4/5 French slender sheath. The sheath was flushed followed by installation of standard radial cocktail. Under direct fluoroscopic guidance, a 5 French MG2 catheter and Bentson wire were directed to the level of the aortic arch. There was difficulty in cannulating the descending thoracic aorta, therefore the catheter was exchanged over a Wholey wire for a pigtail catheter. Through the pigtail catheter, a Bentson wire was directed into the descending thoracic aorta and abdominal aorta. The pigtail catheter was then exchanged for the  MG2 catheter. The catheter was then used to select the right internal iliac artery. Internal iliac artery angiogram was performed which demonstrated standard branching configuration of the posterior and anterior divisions with identification of the prostatic artery arising from the proximal anterior division. A 1.9 French lantern microcatheter and fathom 14 wire were then used to select the prostatic vesicular branch. Angiogram of the prostatic a saccular branch artery was performed which demonstrated superior branches supplying the urinary bladder and inferior branch identified as the inferior rectal artery. Cone beam CT was performed which confirmed this anatomic distribution of these vessels. There is shared supply of the right hemi prostate from the right inferior rectal artery. Therefore, the right inferior rectal artery was selected distally and coil embolization was performed with 3, 2 mm 018 inch Azur detachable coils. Additionally, a small volume of Gel-Foam slurry was administered for additional embolic protection. The catheter was then retracted and the right prostatic artery was selected. Additional cone beam CT was performed to confirm location in the right hemi prostate. Particle embolization was then performed with dilute solution of 400 micron Hydropearls until stasis was achieved. The microcatheter was then removed. The base catheter was then retracted under fluoroscopic guidance and used to select the left internal iliac artery. Left internal iliac angiogram was performed which demonstrated standard  branching configuration of the posterior and anterior divisions with a prominent prostatic artery arising from the proximal anterior division. The 1.9 French lantern microcatheter and fathom 14 wire were then used to select the left prostatic artery. Prostatic artery angiogram demonstrated dominant supply of the left hemi prostate without evidence of non target branches. Cone beam CT was performed which  again demonstrated dominant left hemi prostatic supply without evidence of nontarget branches. Particle embolization was then performed of the left prostatic artery with 400 micrometer Hydropearls until stasis was achieved. The catheter was retracted into the proximal prostatic artery and completion left prostatic angiogram was performed which demonstrated adequate embolization. The catheters were removed. A TR band was applied in insufflated. The sheath was removed. The patient tolerated the procedure well without immediate complication and was transferred to the recovery area in good condition. IMPRESSION: 1. Prostatomegaly with left prostatic artery dominant supply to the prostate gland. 2. Coil embolization of the right inferior rectal artery secondary to showed supply with a right prostatic artery for embolic protection purposes. 3. Technically successful particle embolization of the bilateral prostatic arteries. Marliss Coots, MD [facility] Radiology Electronically Signed   By: Marliss Coots M.D.   On: 06/07/2022 07:35   IR Angiogram Selective Each Additional Vessel  Result Date: 06/07/2022 INDICATION: 77 year old male with history of benign prostatic hyperplasia with the lower urinary tract symptoms including episodes of acute urinary retention requiring catheterization and nocturia. EXAM: 1. Ultrasound-guided vascular access of the left radial artery 2. Selective catheterization angiography of the bilateral common iliac, internal iliac, and prostatic arteries 3. Cone beam CT 4. Coil embolization of right inferior rectal artery 5. Bilateral prostate artery embolization MEDICATIONS: Ciprofloxacin 400 mg IV. The antibiotic was administered within 1 hour of the procedure ANESTHESIA/SEDATION: Moderate (conscious) sedation was employed during this procedure. A total of Versed 4 mg and Fentanyl 150 mcg was administered intravenously. Moderate Sedation Time: 180  minutes. The patient's level of consciousness and vital signs were monitored continuously by radiology nursing throughout the procedure under my direct supervision. CONTRAST:  OMNIPAQUE IOHEXOL 300 MG/ML SOLN, 25mL OMNIPAQUE IOHEXOL 300 MG/ML SOLN, 5mL OMNIPAQUE IOHEXOL 300 MG/ML SOLN FLUOROSCOPY: Radiation Exposure Index (as provided by the fluoroscopic device): 3,963 mGy Kerma COMPLICATIONS: None immediate. PROCEDURE: Informed consent was obtained from the patient following explanation of the procedure, risks, benefits and alternatives. The patient understands, agrees and consents for the procedure. All questions were addressed. A time out was performed prior to the initiation of the procedure. Maximal barrier sterile technique utilized including caps, mask, sterile gowns, sterile gloves, large sterile drape, hand hygiene, and Betadine prep. The left wrist was prepped and draped in standard fashion. Pulse oximeter was attached to the left thumb. Tora Perches test was performed, grade A. The left radial artery measured 0.3 cm in diameter. Subdermal Local anesthesia was provided at the planned needle entry site with 1% lidocaine. A small skin nick was made. Under direct ultrasound visualization, the left radial artery was punctured with a 21 gauge micropuncture needle. A permanent image was captured and stored in the record. A microwire was placed and exchanged for a 4/5 French slender sheath. The sheath was flushed followed by installation of standard radial cocktail. Under direct fluoroscopic guidance, a 5 French MG2 catheter and Bentson wire were directed to the level of the aortic arch. There was difficulty in cannulating the descending thoracic aorta, therefore the catheter was exchanged over a Wholey wire for a pigtail catheter. Through  the pigtail catheter, a Bentson wire was directed into the descending thoracic aorta and abdominal aorta. The pigtail catheter was then exchanged for the MG2 catheter. The  catheter was then used to select the right internal iliac artery. Internal iliac artery angiogram was performed which demonstrated standard branching configuration of the posterior and anterior divisions with identification of the prostatic artery arising from the proximal anterior division. A 1.9 French lantern microcatheter and fathom 14 wire were then used to select the prostatic vesicular branch. Angiogram of the prostatic a saccular branch artery was performed which demonstrated superior branches supplying the urinary bladder and inferior branch identified as the inferior rectal artery. Cone beam CT was performed which confirmed this anatomic distribution of these vessels. There is shared supply of the right hemi prostate from the right inferior rectal artery. Therefore, the right inferior rectal artery was selected distally and coil embolization was performed with 3, 2 mm 018 inch Azur detachable coils. Additionally, a small volume of Gel-Foam slurry was administered for additional embolic protection. The catheter was then retracted and the right prostatic artery was selected. Additional cone beam CT was performed to confirm location in the right hemi prostate. Particle embolization was then performed with dilute solution of 400 micron Hydropearls until stasis was achieved. The microcatheter was then removed. The base catheter was then retracted under fluoroscopic guidance and used to select the left internal iliac artery. Left internal iliac angiogram was performed which demonstrated standard branching configuration of the posterior and anterior divisions with a prominent prostatic artery arising from the proximal anterior division. The 1.9 French lantern microcatheter and fathom 14 wire were then used to select the left prostatic artery. Prostatic artery angiogram demonstrated dominant supply of the left hemi prostate without evidence of non target branches. Cone beam CT was performed which again  demonstrated dominant left hemi prostatic supply without evidence of nontarget branches. Particle embolization was then performed of the left prostatic artery with 400 micrometer Hydropearls until stasis was achieved. The catheter was retracted into the proximal prostatic artery and completion left prostatic angiogram was performed which demonstrated adequate embolization. The catheters were removed. A TR band was applied in insufflated. The sheath was removed. The patient tolerated the procedure well without immediate complication and was transferred to the recovery area in good condition. IMPRESSION: 1. Prostatomegaly with left prostatic artery dominant supply to the prostate gland. 2. Coil embolization of the right inferior rectal artery secondary to showed supply with a right prostatic artery for embolic protection purposes. 3. Technically successful particle embolization of the bilateral prostatic arteries. Marliss Coots, MD [facility] Radiology Electronically Signed   By: Marliss Coots M.D.   On: 06/07/2022 07:35   IR CT Spine Ltd  Result Date: 06/07/2022 INDICATION: 77 year old male with history of benign prostatic hyperplasia with the lower urinary tract symptoms including episodes of acute urinary retention requiring catheterization and nocturia. EXAM: 1. Ultrasound-guided vascular access of the left radial artery 2. Selective catheterization angiography of the bilateral common iliac, internal iliac, and prostatic arteries 3. Cone beam CT 4. Coil embolization of right inferior rectal artery 5. Bilateral prostate artery embolization MEDICATIONS: Ciprofloxacin 400 mg IV. The antibiotic was administered within 1 hour of the procedure ANESTHESIA/SEDATION: Moderate (conscious) sedation was employed during this procedure. A total of Versed 4 mg and Fentanyl 150 mcg was administered intravenously. Moderate Sedation Time: 180 minutes. The patient's level of  consciousness and vital signs were monitored continuously by radiology nursing throughout the  procedure under my direct supervision. CONTRAST:  OMNIPAQUE IOHEXOL 300 MG/ML SOLN, 25mL OMNIPAQUE IOHEXOL 300 MG/ML SOLN, 5mL OMNIPAQUE IOHEXOL 300 MG/ML SOLN FLUOROSCOPY: Radiation Exposure Index (as provided by the fluoroscopic device): 3,963 mGy Kerma COMPLICATIONS: None immediate. PROCEDURE: Informed consent was obtained from the patient following explanation of the procedure, risks, benefits and alternatives. The patient understands, agrees and consents for the procedure. All questions were addressed. A time out was performed prior to the initiation of the procedure. Maximal barrier sterile technique utilized including caps, mask, sterile gowns, sterile gloves, large sterile drape, hand hygiene, and Betadine prep. The left wrist was prepped and draped in standard fashion. Pulse oximeter was attached to the left thumb. Tora Perches test was performed, grade A. The left radial artery measured 0.3 cm in diameter. Subdermal Local anesthesia was provided at the planned needle entry site with 1% lidocaine. A small skin nick was made. Under direct ultrasound visualization, the left radial artery was punctured with a 21 gauge micropuncture needle. A permanent image was captured and stored in the record. A microwire was placed and exchanged for a 4/5 French slender sheath. The sheath was flushed followed by installation of standard radial cocktail. Under direct fluoroscopic guidance, a 5 French MG2 catheter and Bentson wire were directed to the level of the aortic arch. There was difficulty in cannulating the descending thoracic aorta, therefore the catheter was exchanged over a Wholey wire for a pigtail catheter. Through the pigtail catheter, a Bentson wire was directed into the descending thoracic aorta and abdominal aorta. The pigtail catheter was then exchanged for the MG2 catheter. The catheter was then used to select  the right internal iliac artery. Internal iliac artery angiogram was performed which demonstrated standard branching configuration of the posterior and anterior divisions with identification of the prostatic artery arising from the proximal anterior division. A 1.9 French lantern microcatheter and fathom 14 wire were then used to select the prostatic vesicular branch. Angiogram of the prostatic a saccular branch artery was performed which demonstrated superior branches supplying the urinary bladder and inferior branch identified as the inferior rectal artery. Cone beam CT was performed which confirmed this anatomic distribution of these vessels. There is shared supply of the right hemi prostate from the right inferior rectal artery. Therefore, the right inferior rectal artery was selected distally and coil embolization was performed with 3, 2 mm 018 inch Azur detachable coils. Additionally, a small volume of Gel-Foam slurry was administered for additional embolic protection. The catheter was then retracted and the right prostatic artery was selected. Additional cone beam CT was performed to confirm location in the right hemi prostate. Particle embolization was then performed with dilute solution of 400 micron Hydropearls until stasis was achieved. The microcatheter was then removed. The base catheter was then retracted under fluoroscopic guidance and used to select the left internal iliac artery. Left internal iliac angiogram was performed which demonstrated standard branching configuration of the posterior and anterior divisions with a prominent prostatic artery arising from the proximal anterior division. The 1.9 French lantern microcatheter and fathom 14 wire were then used to select the left prostatic artery. Prostatic artery angiogram demonstrated dominant supply of the left hemi prostate without evidence of non target branches. Cone beam CT was performed which again demonstrated dominant left hemi prostatic  supply without evidence of nontarget branches. Particle embolization was then performed of the left prostatic artery with 400 micrometer Hydropearls until stasis was achieved. The catheter was retracted into the proximal prostatic artery  and completion left prostatic angiogram was performed which demonstrated adequate embolization. The catheters were removed. A TR band was applied in insufflated. The sheath was removed. The patient tolerated the procedure well without immediate complication and was transferred to the recovery area in good condition. IMPRESSION: 1. Prostatomegaly with left prostatic artery dominant supply to the prostate gland. 2. Coil embolization of the right inferior rectal artery secondary to showed supply with a right prostatic artery for embolic protection purposes. 3. Technically successful particle embolization of the bilateral prostatic arteries. Marliss Coots, MD [facility] Radiology Electronically Signed   By: Marliss Coots M.D.   On: 06/07/2022 07:35   IR EMBO ARTERIAL NOT HEMORR HEMANG INC GUIDE ROADMAPPING  Result Date: 06/07/2022 INDICATION: 77 year old male with history of benign prostatic hyperplasia with the lower urinary tract symptoms including episodes of acute urinary retention requiring catheterization and nocturia. EXAM: 1. Ultrasound-guided vascular access of the left radial artery 2. Selective catheterization angiography of the bilateral common iliac, internal iliac, and prostatic arteries 3. Cone beam CT 4. Coil embolization of right inferior rectal artery 5. Bilateral prostate artery embolization MEDICATIONS: Ciprofloxacin 400 mg IV. The antibiotic was administered within 1 hour of the procedure ANESTHESIA/SEDATION: Moderate (conscious) sedation was employed during this procedure. A total of Versed 4 mg and Fentanyl 150 mcg was administered intravenously. Moderate Sedation Time: 180 minutes. The patient's level of  consciousness and vital signs were monitored continuously by radiology nursing throughout the procedure under my direct supervision. CONTRAST:  OMNIPAQUE IOHEXOL 300 MG/ML SOLN, 25mL OMNIPAQUE IOHEXOL 300 MG/ML SOLN, 5mL OMNIPAQUE IOHEXOL 300 MG/ML SOLN FLUOROSCOPY: Radiation Exposure Index (as provided by the fluoroscopic device): 3,963 mGy Kerma COMPLICATIONS: None immediate. PROCEDURE: Informed consent was obtained from the patient following explanation of the procedure, risks, benefits and alternatives. The patient understands, agrees and consents for the procedure. All questions were addressed. A time out was performed prior to the initiation of the procedure. Maximal barrier sterile technique utilized including caps, mask, sterile gowns, sterile gloves, large sterile drape, hand hygiene, and Betadine prep. The left wrist was prepped and draped in standard fashion. Pulse oximeter was attached to the left thumb. Tora Perches test was performed, grade A. The left radial artery measured 0.3 cm in diameter. Subdermal Local anesthesia was provided at the planned needle entry site with 1% lidocaine. A small skin nick was made. Under direct ultrasound visualization, the left radial artery was punctured with a 21 gauge micropuncture needle. A permanent image was captured and stored in the record. A microwire was placed and exchanged for a 4/5 French slender sheath. The sheath was flushed followed by installation of standard radial cocktail. Under direct fluoroscopic guidance, a 5 French MG2 catheter and Bentson wire were directed to the level of the aortic arch. There was difficulty in cannulating the descending thoracic aorta, therefore the catheter was exchanged over a Wholey wire for a pigtail catheter. Through the pigtail catheter, a Bentson wire was directed into the descending thoracic aorta and abdominal aorta. The pigtail catheter was then exchanged for the MG2 catheter. The catheter was then used to select  the right internal iliac artery. Internal iliac artery angiogram was performed which demonstrated standard branching configuration of the posterior and anterior divisions with identification of the prostatic artery arising from the proximal anterior division. A 1.9 French lantern microcatheter and fathom 14 wire were then used to select the prostatic vesicular branch. Angiogram of the prostatic a saccular branch artery was performed which  demonstrated superior branches supplying the urinary bladder and inferior branch identified as the inferior rectal artery. Cone beam CT was performed which confirmed this anatomic distribution of these vessels. There is shared supply of the right hemi prostate from the right inferior rectal artery. Therefore, the right inferior rectal artery was selected distally and coil embolization was performed with 3, 2 mm 018 inch Azur detachable coils. Additionally, a small volume of Gel-Foam slurry was administered for additional embolic protection. The catheter was then retracted and the right prostatic artery was selected. Additional cone beam CT was performed to confirm location in the right hemi prostate. Particle embolization was then performed with dilute solution of 400 micron Hydropearls until stasis was achieved. The microcatheter was then removed. The base catheter was then retracted under fluoroscopic guidance and used to select the left internal iliac artery. Left internal iliac angiogram was performed which demonstrated standard branching configuration of the posterior and anterior divisions with a prominent prostatic artery arising from the proximal anterior division. The 1.9 French lantern microcatheter and fathom 14 wire were then used to select the left prostatic artery. Prostatic artery angiogram demonstrated dominant supply of the left hemi prostate without evidence of non target branches. Cone beam CT was performed which again demonstrated dominant left hemi prostatic  supply without evidence of nontarget branches. Particle embolization was then performed of the left prostatic artery with 400 micrometer Hydropearls until stasis was achieved. The catheter was retracted into the proximal prostatic artery and completion left prostatic angiogram was performed which demonstrated adequate embolization. The catheters were removed. A TR band was applied in insufflated. The sheath was removed. The patient tolerated the procedure well without immediate complication and was transferred to the recovery area in good condition. IMPRESSION: 1. Prostatomegaly with left prostatic artery dominant supply to the prostate gland. 2. Coil embolization of the right inferior rectal artery secondary to showed supply with a right prostatic artery for embolic protection purposes. 3. Technically successful particle embolization of the bilateral prostatic arteries. Marliss Coots, MD [facility] Radiology Electronically Signed   By: Marliss Coots M.D.   On: 06/07/2022 07:35   CT ANGIO PELVIS W OR WO CONTRAST  Result Date: 05/28/2022 CLINICAL DATA:  Prostate enlargement, preop planning EXAM: CT ANGIOGRAPHY PELVIS TECHNIQUE: Multidetector CT imaging through the pelvis was performed using the standard protocol during bolus administration of intravenous contrast. Multiplanar reconstructed images and MIPs were obtained and reviewed to evaluate the vascular anatomy. RADIATION DOSE REDUCTION: This exam was performed according to the departmental dose-optimization program which includes automated exposure control, adjustment of the mA and/or kV according to patient size and/or use of iterative reconstruction technique. CONTRAST:  75mL ISOVUE-370 IOPAMIDOL (ISOVUE-370) INJECTION 76% COMPARISON:  12/27/2004 FINDINGS: VASCULAR Aorta: Visualized infrarenal segment normal. IMA: Patent, origin not visualized. Inflow: Minimal calcified plaque at the bifurcation of both  common iliac arteries. Minimal scattered plaque in the left internal iliac artery. Mild tortuosity. No aneurysm, dissection, or stenosis. Visualized portions of bilateral lower extremity arterial outflow are widely patent. Veins: Common femoral veins and iliac venous system unremarkable. Review of the MIP images confirms the above findings. NON-VASCULAR Urinary bladder partially distended.  Prostate enlargement. Visualized portions of small bowel and colon are nondilated. Scattered distal descending and proximal sigmoid diverticula without adjacent inflammatory change. No adenopathy. Small paraumbilical hernia containing only mesenteric fat. No pelvic ascites. 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 Review of the MIP images confirms the above findings. IMPRESSION: 1. Minimal iliac  atherosclerotic plaque. No significant occlusive disease. 2. Descending and sigmoid diverticulosis. Electronically Signed   By: Corlis Leak M.D.   On: 05/28/2022 20:58   IR Radiologist Eval & Mgmt  Result Date: 05/18/2022 EXAM: NEW PATIENT OFFICE VISIT CHIEF COMPLAINT: See Epic note. HISTORY OF PRESENT ILLNESS: See Epic note. REVIEW OF SYSTEMS: See Epic note. PHYSICAL EXAMINATION: See Epic note. ASSESSMENT AND PLAN: See Epic note. Marliss Coots, MD Vascular and Interventional Radiology [facility] Radiology Electronically Signed   By: Marliss Coots M.D.   On: 05/18/2022 11:18    Radiology No results found.  Procedures Procedures    Medications Ordered in ED Medications - No data to display  ED Course/ Medical Decision Making/ A&P                             Medical Decision Making Pt with inability to urinate with foley in place   Amount and/or Complexity of Data Reviewed External Data Reviewed: notes.    Details: Previous notes reviewed  Labs: ordered.    Details: Normal sodium and low potassium 3.2 and elevated creatinine 1.95  Risk Risk Details: Foley replaced and now flowing.  Elevated  creatinine.  Patient advised to drink copious fluids, particularly water and keep large catheter reservoir on and call PMD for recheck of creatinine in 5 days. I have explained this at length both verbally and on discharge paperwork.  Stable for discharge.  Follow up with urology and your PMD for ongoing care.      Final Clinical Impression(s) / ED Diagnoses Final diagnoses:  Urinary retention  Problem with Foley catheter, initial encounter (HCC)   Return for intractable cough, coughing up blood, fevers > 100.4 unrelieved by medication, shortness of breath, intractable vomiting, chest pain, shortness of breath, weakness, numbness, changes in speech, facial asymmetry, abdominal pain, passing out, Inability to tolerate liquids or food, cough, altered mental status or any concerns. No signs of systemic illness or infection. The patient is nontoxic-appearing on exam and vital signs are within normal limits.  I have reviewed the triage vital signs and the nursing notes. Pertinent labs & imaging results that were available during my care of the patient were reviewed by me and considered in my medical decision making (see chart for details). After history, exam, and medical workup I feel the patient has been appropriately medically screened and is safe for discharge home. Pertinent diagnoses were discussed with the patient. Patient was given return precautions.     Dylyn Mclaren, MD 06/12/22 [phone]

## 2022-06-12 NOTE — Discharge Instructions (Addendum)
Creatinine 1.95, you must drink copious water

## 2022-06-12 NOTE — ED Triage Notes (Signed)
Pt was seen here on Saturday for catheterization after a "prostate artery embolization" earlier in the week. Pt states he hasn't been able to urinate since yesterday evening. States "he strains when urinating but doesn't see it in the bag." Pt states he saw a urologist yesterday before blockage occurred.

## 2022-06-13 ENCOUNTER — Other Ambulatory Visit (HOSPITAL_COMMUNITY): Payer: Self-pay | Admitting: Interventional Radiology

## 2022-06-13 DIAGNOSIS — N401 Enlarged prostate with lower urinary tract symptoms: Secondary | ICD-10-CM

## 2022-06-14 ENCOUNTER — Ambulatory Visit (HOSPITAL_COMMUNITY)
Admission: RE | Admit: 2022-06-14 | Discharge: 2022-06-14 | Disposition: A | Discharge status: Home / Self Care | Payer: Medicare Other | Source: Ambulatory Visit | Attending: Interventional Radiology | Admitting: Interventional Radiology

## 2022-06-14 DIAGNOSIS — N401 Enlarged prostate with lower urinary tract symptoms: Secondary | ICD-10-CM

## 2022-06-14 HISTORY — PX: IR RADIOLOGIST EVAL & MGMT: IMG5224

## 2022-06-14 NOTE — Progress Notes (Signed)
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  This was placed on 06/08/22.  He called me earlier this week with concern for clogging of the Foley due to blood clots.  We FaceTimed and he had red blood in the Foley bag.  He attempted to find saline flushes to clear the Foley, was unable to, but the clots have cleared and he has had normal urine output since then.  I requested he come today for in person evaluation.  The urine output remains blood tinged, though orangey-brown.  I suspect some of this is related to pyridium which he continues to take.  He has completed week long course of cipro and solifenacin.  He reports no pain, fevers, chills, SOB.  He says he's feeling low energy, but denies any dizziness or lightheadedness.    I flushed the Foley without difficulty, irrigated with 50 mL saline.  He has follow up with [name] on Monday, 5/6.  We discussed: -keeping Foley in until seeing [name] on Monday -provided him a leg bag and regular Foley bag along with some saline flushes should he need them -he will call me with any issues over the weekend -hopeful urine will continue to clear and he can have Foley removed Monday  Marliss Coots, MD Pager: [phone]

## 2022-06-17 DIAGNOSIS — R338 Other retention of urine: Secondary | ICD-10-CM | POA: Diagnosis not present

## 2022-06-17 DIAGNOSIS — R311 Benign essential microscopic hematuria: Secondary | ICD-10-CM | POA: Diagnosis not present

## 2022-07-05 NOTE — Progress Notes (Signed)
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 His urologist [name] referred Dr. Gala Romney to Interventional Radiology for consideration of prostate artery embolization secondary to complaints of lower urinary tract symptoms. These symptoms included multiple prior episodes of acute urinary retention requiring catheterization, several episodes of straining to urinate, frequency and significant nocturia. He travels frequently and the urinary issues created major fears and limitations with his activities.  He takes tamsulosin and denied recent urinary tract infections or hematuria. He has a decent urinary stream.   Prior MRI (2014) and prostate biopsy showed benign prostate hyperplasia with no signs of prostate cancer.  Recent in-office Urology ultrasound reported a 400 g prostate. He has had a gradual elevation in PSA levels, but PSA density is low.  Given his history and symptoms he was deemed an excellent candidate for prostate artery embolization.  A CTA Pelvis 05/27/22 showed anatomy amenable to embolization.  On 06/06/22 he underwent angiography of the bilateral common iliac, internal iliac and prostate arteries followed by coil embolization of the right inferior rectal artery and bilateral prostate artery particle embolization.  He tolerated the procedure well and was discharged home the same day.   He presented to the ED 06/08/22 with complaints of urinary retention requiring self-catheterization at home. After a discussion with [name], the ED physician had a foley catheter placed but he returned to the ED 06/12/22 with catheter malfunction secondary to blood clots. The foley was exchanged and the patient followed up with me 06/14/22 for further  evaluation. During our visit the urine remained blood-tinged with orange-brown discoloration. This was likely due to taking Pyridium.  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 He denied pain, fevers, chills, dizziness and shortness of breath but did have complaints of low energy levels.  I was able to flush the foley catheter without difficulty and he was instructed to maintain the foley catheter until his follow up with [name] 06/17/22.  The next day, he called to say the catheter fell out spontaneously, but he was then able to void independently.  He reports significant improvement in his urinary symptoms.  He reports less frequency and urgency.  His nocturia has decreased.  He states that he has a much improved quality of life and is able to get back to traveling and doing things he's interested in with less obsession about finding a bathroom.  He has had no hematuria since the catheter came out.  He remains on Flomax.         Past Medical History:  Diagnosis Date   Anxiety    Arthritis    Cataract    right eye   Colon polyps    Elevated PSA    History of kidney stones    over 10 yrs ago   Hypertension 12-20-10   tx. Lotrel   Hypertrophy of prostate without urinary obstruction and other lower urinary tract symptoms (LUTS)    Lipoma of neck October 2012   posterior neck lesion    Other malaise and fatigue    Tubular adenoma 09/2010    Past Surgical History:  Procedure Laterality Date   ANAL RECTAL MANOMETRY N/A 08/06/2019   Procedure: ANO RECTAL MANOMETRY;  Surgeon: Charlott Rakes, MD;  Location:  [facility];  Service: Endoscopy;  Laterality: N/A;   IR ANGIOGRAM PELVIS SELECTIVE OR SUPRASELECTIVE  06/06/2022   IR ANGIOGRAM SELECTIVE EACH ADDITIONAL VESSEL  06/06/2022   IR ANGIOGRAM SELECTIVE EACH ADDITIONAL VESSEL  06/06/2022   IR CT SPINE LTD  06/06/2022   IR EMBO ARTERIAL NOT HEMORR HEMANG INC GUIDE ROADMAPPING  06/06/2022   IR EMBO ARTERIAL NOT  HEMORR HEMANG INC GUIDE ROADMAPPING  06/06/2022   IR RADIOLOGIST EVAL & MGMT  05/18/2022   IR RADIOLOGIST EVAL & MGMT  06/14/2022   IR US GUIDE VASC ACCESS LEFT  06/06/2022   KNEE ARTHROCENTESIS  1989   left knee -scope   MASS EXCISION  12/24/2010   Procedure: EXCISION MASS;  Surgeon: Wilmon Arms. 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  Medications: Prior to Admission medications   Medication Sig Start Date End Date Taking? Authorizing Provider  amLODipine-benazepril (LOTREL) 5-10 MG per capsule Take 1 capsule by mouth daily.    [provider]  cephALEXin (KEFLEX) 500 MG capsule Take 1 capsule (500 mg total) by mouth 3 (three) times daily. 07/31/20   Terrilee Files, MD  ondansetron (ZOFRAN ODT) 4 MG disintegrating tablet Take 1 tablet (4 mg total) by mouth every 8 (eight) hours as needed for nausea or vomiting. 01/25/20   Hall-Potvin, [place], PA-C  prochlorperazine (COMPAZINE) 10 MG tablet Take 1 tablet (10 mg total) by mouth every 6 (six) hours as needed for nausea or vomiting (or headache). 01/26/20   Dione Booze, MD  solifenacin (VESICARE) 5 MG tablet Take 1 tablet (5 mg total) by mouth daily. 06/06/22   Shon Hough, NP  tamsulosin (FLOMAX) 0.4 MG CAPS capsule Take 0.4 mg by mouth at bedtime.    [provider]     Family History  Problem Relation Age of Onset   Cancer Mother     Social History   Socioeconomic History   Marital status: Single    Spouse name: Not on file   Number of children: Not on file   Years of education: Not on file   Highest education level: Not on file  Occupational History   Not on file   Tobacco Use   Smoking status: Never   Smokeless tobacco: Never  Vaping Use   Vaping Use: Never used  Substance and Sexual Activity   Alcohol use: Yes    Comment: 1 glass per week   Drug use: No   Sexual activity: Not on file  Other Topics Concern   Not on file  Social History Narrative   Not on file   Social Determinants of Health   Financial Resource Strain: Not on file  Food Insecurity: Not on file  Transportation Needs: Not on file  Physical Activity: Not on file  Stress: Not on file  Social Connections: Not on file     Vital Signs: There were no vitals taken for this visit.   Gen:  alert, oriented, non-distressed HEENT: moist oral mucosa, no scleral icterus Pulm: normal work of breathing Card: normal rate, regular rhythm Abd: non distended GU: Foley removed Ext: no edema Skin: warm, dry  Imaging: MRI Prostate 01/06/13   7.7 x 6.0 x 4.5 cm = 109 g  PAE 06/06/22 IMPRESSION: 1. Prostatomegaly with left prostatic artery dominant supply to the prostate gland. 2. Coil embolization of the right inferior rectal artery secondary to showed supply with a right prostatic artery for embolic protection purposes. 3. Technically successful particle embolization of the bilateral prostatic arteries.   Labs:  CBC: Recent Labs    06/06/22 1012  WBC 6.0  HGB 15.8  HCT 46.0  PLT 217    COAGS: Recent Labs    06/06/22 1012  INR 1.1    BMP: Recent Labs    06/06/22 1012 06/12/22 0600  NA 137 138  K 3.8 3.2*  CL 105 106  CO2 22 23  GLUCOSE 108* 132*  BUN 13 36*  CALCIUM 9.3 8.8*  CREATININE 1.05 1.95*  GFRNONAA >60 35*    LIVER FUNCTION TESTS: No results for input(s): "BILITOT", "AST", "ALT", "ALKPHOS", "PROT", "ALBUMIN" in the last 8760 hours.  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 He has a massively enlarged prostate, reported at 400 g on Urology office ultrasound. He is now bilateral prostate artery particle embolization including coil embolization of the right inferior rectal artery due to origination from the right prostatic artery for embolization protection purposes. Post procedure he developed urinary retention secondary to blood clots and he required Foley catheter placement. His Foley was accidentally removed, and left out due to ability to void independently.  His LUTS have significantly improved since the procedure and he is very satisfied with the results so far.   -3 month MRI prostate in late July, with clinic follow up shortly thereafter -recommend not measuring PSA until at least 3 months after procedure due to false elevation   Marliss Coots, MD Pager: [phone]    I spent a total of 25 Minutes in face-to-face clinical consultation, greater than 50% of which was counseling/coordinating care for benign prostatic hyperplasia.

## 2022-07-09 ENCOUNTER — Ambulatory Visit
Admission: RE | Admit: 2022-07-09 | Discharge: 2022-07-09 | Disposition: A | Discharge status: Home / Self Care | Payer: Medicare Other | Source: Ambulatory Visit | Attending: Interventional Radiology | Admitting: Interventional Radiology

## 2022-07-09 DIAGNOSIS — N401 Enlarged prostate with lower urinary tract symptoms: Secondary | ICD-10-CM

## 2022-07-09 HISTORY — PX: IR RADIOLOGIST EVAL & MGMT: IMG5224

## 2022-08-20 DIAGNOSIS — D125 Benign neoplasm of sigmoid colon: Secondary | ICD-10-CM | POA: Diagnosis not present

## 2022-08-20 DIAGNOSIS — Z09 Encounter for follow-up examination after completed treatment for conditions other than malignant neoplasm: Secondary | ICD-10-CM | POA: Diagnosis not present

## 2022-08-20 DIAGNOSIS — K648 Other hemorrhoids: Secondary | ICD-10-CM | POA: Diagnosis not present

## 2022-08-20 DIAGNOSIS — D123 Benign neoplasm of transverse colon: Secondary | ICD-10-CM | POA: Diagnosis not present

## 2022-08-20 DIAGNOSIS — Z86010 Personal history of colonic polyps: Secondary | ICD-10-CM | POA: Diagnosis not present

## 2022-08-20 DIAGNOSIS — K5730 Diverticulosis of large intestine without perforation or abscess without bleeding: Secondary | ICD-10-CM | POA: Diagnosis not present

## 2022-08-22 DIAGNOSIS — D123 Benign neoplasm of transverse colon: Secondary | ICD-10-CM | POA: Diagnosis not present

## 2022-08-22 DIAGNOSIS — D125 Benign neoplasm of sigmoid colon: Secondary | ICD-10-CM | POA: Diagnosis not present

## 2022-09-12 ENCOUNTER — Other Ambulatory Visit: Payer: Self-pay | Admitting: Interventional Radiology

## 2022-09-12 DIAGNOSIS — N400 Enlarged prostate without lower urinary tract symptoms: Secondary | ICD-10-CM

## 2022-10-13 ENCOUNTER — Ambulatory Visit
Admission: RE | Admit: 2022-10-13 | Discharge: 2022-10-13 | Disposition: A | Discharge status: Home / Self Care | Payer: Medicare Other | Source: Ambulatory Visit | Attending: Interventional Radiology | Admitting: Interventional Radiology

## 2022-10-13 DIAGNOSIS — N400 Enlarged prostate without lower urinary tract symptoms: Secondary | ICD-10-CM

## 2022-10-13 MED ORDER — GADOPICLENOL 0.5 MMOL/ML IV SOLN
10.0000 mL | Freq: Once | INTRAVENOUS | Status: AC | PRN
  Administered 2022-10-13: 10 mL via INTRAVENOUS

## 2022-10-18 ENCOUNTER — Ambulatory Visit
Admission: RE | Admit: 2022-10-18 | Discharge: 2022-10-18 | Disposition: A | Discharge status: Home / Self Care | Payer: Medicare Other | Source: Ambulatory Visit | Attending: Interventional Radiology | Admitting: Interventional Radiology

## 2022-10-18 DIAGNOSIS — R339 Retention of urine, unspecified: Secondary | ICD-10-CM | POA: Diagnosis not present

## 2022-10-18 DIAGNOSIS — N400 Enlarged prostate without lower urinary tract symptoms: Secondary | ICD-10-CM

## 2022-10-18 HISTORY — PX: IR RADIOLOGIST EVAL & MGMT: IMG5224

## 2022-10-18 NOTE — Progress Notes (Signed)
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 His urologist 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 These symptoms included multiple prior episodes of acute urinary retention requiring catheterization, several episodes of straining to urinate, frequency and significant nocturia. He travels frequently and the urinary issues created major fears and limitations with his activities.  He takes tamsulosin and denied recent urinary tract infections or hematuria. He has a decent urinary stream.    Prior MRI (2014) and prostate biopsy showed benign prostate hyperplasia with no signs of prostate cancer.  Recent in-office Urology ultrasound reported a 400 g prostate. He has had a gradual elevation in PSA levels, but PSA density is low.  Given his history and symptoms he was deemed an excellent candidate for prostate artery embolization.  A CTA Pelvis 05/27/22 showed anatomy amenable to embolization.   On 06/06/22 he underwent angiography of the bilateral common iliac, internal iliac and prostate arteries followed by coil embolization of the right inferior rectal artery and bilateral prostate artery particle embolization.  He tolerated the procedure well and was discharged home the same day.    He presented to the ED 06/08/22 with complaints of urinary retention requiring self-catheterization at home. After a discussion with [name], the ED physician had a foley catheter placed but he returned to the ED 06/12/22 with catheter malfunction secondary to blood clots. The foley was exchanged and the patient followed up with me 06/14/22 for further evaluation.  During our visit the urine remained blood-tinged with orange-brown discoloration. This was likely due to taking Pyridium.  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 He denied pain, fevers, chills, dizziness and shortness of breath but did have complaints of low energy levels.  I was able to flush the foley catheter without difficulty and he was instructed to maintain the foley catheter until his follow up with [name] 06/17/22.  The next day, he called to say the catheter fell out spontaneously, but he was then able to void independently.   He reports significant improvement in his urinary symptoms.  He reports less frequency and urgency.  His nocturia has decreased.  He states that he has a much improved quality of life and is able to get back to traveling and doing things he's interested in with less obsession about finding a bathroom.  He has had no hematuria since the catheter came out.  He remains on Flomax.  He presents via virtual telephone visit today and overall reports doing very well.  His urinary tract symptoms have continued to improve.  He has no urgency now.  He reports much improved flow.  His nocturia has improved to only twice per night.  He notices he has significantly easier bowel movements now.  No hematuria or urinary tract infections.  He continues to take flomax.              Past Medical History:  Diagnosis Date   Anxiety    Arthritis    Cataract    right eye   Colon polyps    Elevated PSA    History of kidney stones    over 10 yrs ago   Hypertension  12-20-10   tx. Lotrel   Hypertrophy of prostate without urinary obstruction and other lower urinary tract symptoms (LUTS)    Lipoma of neck October 2012   posterior neck lesion    Other malaise and fatigue    Tubular adenoma 09/2010    Past Surgical History:  Procedure Laterality Date   ANAL RECTAL MANOMETRY N/A 08/06/2019   Procedure: ANO RECTAL MANOMETRY;  Surgeon: Charlott Rakes, MD;  Location: [facility];  Service: Endoscopy;  Laterality: N/A;   IR ANGIOGRAM PELVIS SELECTIVE OR SUPRASELECTIVE  06/06/2022   IR ANGIOGRAM SELECTIVE EACH ADDITIONAL VESSEL  06/06/2022   IR ANGIOGRAM SELECTIVE EACH ADDITIONAL VESSEL  06/06/2022   IR CT SPINE LTD  06/06/2022   IR EMBO ARTERIAL NOT HEMORR HEMANG INC GUIDE ROADMAPPING  06/06/2022   IR EMBO ARTERIAL NOT HEMORR HEMANG INC GUIDE ROADMAPPING  06/06/2022   IR RADIOLOGIST EVAL & MGMT  05/18/2022   IR RADIOLOGIST EVAL & MGMT  06/14/2022   IR RADIOLOGIST EVAL & MGMT  07/09/2022   IR US GUIDE VASC ACCESS LEFT  06/06/2022   KNEE ARTHROCENTESIS  1989   left knee -scope   MASS EXCISION  12/24/2010   Procedure: EXCISION MASS;  Surgeon: Wilmon Arms. 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  Medications: Prior to Admission medications   Medication Sig Start Date End Date Taking? Authorizing Provider  amLODipine-benazepril (LOTREL) 5-10 MG per capsule Take 1 capsule by mouth daily.    [provider]  cephALEXin (KEFLEX) 500 MG capsule Take 1 capsule (500 mg total) by mouth 3 (three) times daily. 07/31/20   Terrilee Files, MD  ondansetron (ZOFRAN ODT) 4 MG disintegrating tablet Take 1 tablet (4 mg total) by mouth every 8 (eight) hours as needed for nausea or vomiting. 01/25/20   Hall-Potvin, [place], PA-C  prochlorperazine (COMPAZINE) 10 MG tablet Take 1 tablet (10 mg total) by mouth every 6 (six) hours as needed for nausea or vomiting (or headache). 01/26/20   Dione Booze, MD  solifenacin (VESICARE) 5 MG tablet Take 1 tablet (5 mg total) by mouth daily. 06/06/22   Shon Hough, NP   tamsulosin (FLOMAX) 0.4 MG CAPS capsule Take 0.4 mg by mouth at bedtime.    [provider]     Family History  Problem Relation Age of Onset   Cancer Mother     Social History   Socioeconomic History   Marital status: Single    Spouse name: Not on file   Number of children: Not on file   Years of education: Not on file   Highest education level: Not on file  Occupational History   Not on file  Tobacco Use   Smoking status: Never   Smokeless tobacco: Never  Vaping Use   Vaping status: Never Used  Substance and Sexual Activity   Alcohol use: Yes    Comment: 1 glass per week   Drug use: No   Sexual activity: Not on file  Other Topics Concern   Not on file  Social History Narrative   Not on file   Social Determinants of Health  Financial Resource Strain: Not on file  Food Insecurity: Not on file  Transportation Needs: Not on file  Physical Activity: Not on file  Stress: Not on file  Social Connections: Not on file     Vital Signs: There were no vitals taken for this visit.  No physical examination was performed in lieu of virtual telephone clinic visit.   Imaging: MRI Prostate 01/06/13   7.7 x 6.0 x 4.5 cm = 109 g  CTA Pelvis 05/27/22  11.0 x 7.6 x 6.7 cm = 280 g   PAE 06/06/22 IMPRESSION: 1. Prostatomegaly with left prostatic artery dominant supply to the prostate gland. 2. Coil embolization of the right inferior rectal artery secondary to showed supply with a right prostatic artery for embolic protection purposes. 3. Technically successful particle embolization of the bilateral prostatic arteries.   MRI Prostate 10/18/22  8.1 x 5.1 x 7.1 cm = 148 g (~47% volume reduction)  Labs:  CBC: Recent Labs    06/06/22 1012  WBC 6.0  HGB 15.8  HCT 46.0  PLT 217    COAGS: Recent Labs    06/06/22 1012  INR 1.1    BMP: Recent Labs    06/06/22 1012 06/12/22 0600  NA 137 138  K 3.8 3.2*  CL 105 106  CO2 22 23  GLUCOSE 108*  132*  BUN 13 36*  CALCIUM 9.3 8.8*  CREATININE 1.05 1.95*  GFRNONAA >60 35*    LIVER FUNCTION TESTS: No results for input(s): "BILITOT", "AST", "ALT", "ALKPHOS", "PROT", "ALBUMIN" in the last 8760 hours.  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 He has a massively enlarged prostate, 280 g pre-procedure. He is now bilateral prostate artery particle embolization including coil embolization of the right inferior rectal artery due to origination from the right prostatic artery for embolization protection purposes. Post procedure he developed urinary retention secondary to blood clots and he required Foley catheter placement. His Foley was accidentally removed, and left out due to ability to void independently.  His LUTS have significantly improved since the procedure and he is very satisfied with the results so far.  MRI prostate at 4 months demonstrates approximately 47% volume reduction.     Plan to follow up in Interventional Radiology in 6 months.  Electronically Signed: Bennie Cobb 10/18/2022, 7:51 AM   I spent a total of 40 Minutes in face to face in clinical consultation, greater than 50% of which was counseling/coordinating care for benign prostatic hyperplasia.

## 2023-02-17 DIAGNOSIS — Z5181 Encounter for therapeutic drug level monitoring: Secondary | ICD-10-CM | POA: Diagnosis not present

## 2023-02-17 DIAGNOSIS — I10 Essential (primary) hypertension: Secondary | ICD-10-CM | POA: Diagnosis not present

## 2023-02-17 DIAGNOSIS — R7303 Prediabetes: Secondary | ICD-10-CM | POA: Diagnosis not present

## 2023-02-17 DIAGNOSIS — Z0000 Encounter for general adult medical examination without abnormal findings: Secondary | ICD-10-CM | POA: Diagnosis not present

## 2023-05-09 ENCOUNTER — Other Ambulatory Visit: Payer: Self-pay | Admitting: Urology

## 2023-05-09 DIAGNOSIS — R972 Elevated prostate specific antigen [PSA]: Secondary | ICD-10-CM

## 2023-06-18 ENCOUNTER — Ambulatory Visit
Admission: RE | Admit: 2023-06-18 | Discharge: 2023-06-18 | Disposition: A | Discharge status: Home / Self Care | Source: Ambulatory Visit | Attending: Urology | Admitting: Urology

## 2023-06-18 DIAGNOSIS — R972 Elevated prostate specific antigen [PSA]: Secondary | ICD-10-CM

## 2023-06-18 MED ORDER — GADOPICLENOL 0.5 MMOL/ML IV SOLN
10.0000 mL | Freq: Once | INTRAVENOUS | Status: AC | PRN
  Administered 2023-06-18: 10 mL via INTRAVENOUS

## 2023-06-20 ENCOUNTER — Other Ambulatory Visit: Payer: Self-pay | Admitting: Interventional Radiology

## 2023-06-20 DIAGNOSIS — N401 Enlarged prostate with lower urinary tract symptoms: Secondary | ICD-10-CM

## 2023-06-27 ENCOUNTER — Other Ambulatory Visit: Payer: Self-pay | Admitting: Internal Medicine

## 2023-06-27 DIAGNOSIS — G459 Transient cerebral ischemic attack, unspecified: Secondary | ICD-10-CM

## 2023-06-27 DIAGNOSIS — I10 Essential (primary) hypertension: Secondary | ICD-10-CM | POA: Diagnosis not present

## 2023-06-27 DIAGNOSIS — G4700 Insomnia, unspecified: Secondary | ICD-10-CM | POA: Diagnosis not present

## 2023-06-30 ENCOUNTER — Ambulatory Visit
Admission: RE | Admit: 2023-06-30 | Discharge: 2023-06-30 | Disposition: A | Discharge status: Home / Self Care | Source: Ambulatory Visit | Attending: Interventional Radiology | Admitting: Interventional Radiology

## 2023-06-30 DIAGNOSIS — N401 Enlarged prostate with lower urinary tract symptoms: Secondary | ICD-10-CM

## 2023-07-02 NOTE — Progress Notes (Signed)
 This encounter was conducted via the [employer] providing interactive audio and visual communication. The patient provided verbal consent to conduct a virtual appointment. The patient was located at their primary residence during this encounter.  Referring Physician(s): Salli Crawley, MD   Chief Complaint: The patient is seen in virtual video follow up today s/p prostate artery embolization 05/18/22  History of present illness: HPI from last clinic visit 10/18/22 Dr. Leodis Cobb is a 78 year old male with a medical history significant for HTN, kidney stones, elevated PSA and benign prostatic hyperplasia. His urologist Dr. Dulcy Gibney referred Danny Cobb to Interventional Radiology for consideration of prostate artery embolization secondary to complaints of lower urinary tract symptoms. These symptoms included multiple prior episodes of acute urinary retention requiring catheterization, several episodes of straining to urinate, frequency and significant nocturia. He travels frequently and the urinary issues created major fears and limitations with his activities. He takes tamsulosin  and denied recent urinary tract infections or hematuria. He has a decent urinary stream.   Prior MRI (2014) and prostate biopsy showed benign prostate hyperplasia with no signs of prostate cancer. Recent in-office Urology ultrasound reported a 400 g prostate. He has had a gradual elevation in PSA levels, but PSA density is low. Given his history and symptoms he was deemed an excellent candidate for prostate artery embolization. A CTA Pelvis 05/27/22 showed anatomy amenable to embolization.   On 06/06/22 he underwent angiography of the bilateral common iliac, internal iliac and prostate arteries followed by coil embolization of the right inferior rectal artery and bilateral prostate artery particle embolization. He tolerated the procedure well and was discharged home the same day.   He presented to the  ED 06/08/22 with complaints of urinary retention requiring self-catheterization at home. After a discussion with Dr. Dulcy Gibney, the ED physician had a foley catheter placed but he returned to the ED 06/12/22 with catheter malfunction secondary to blood clots. The foley was exchanged and the patient followed up with me 06/14/22 for further evaluation. During our visit the urine remained blood-tinged with orange-brown discoloration. This was likely due to taking Pyridium . Danny Cobb had completed a weeklong course of cipro  and solifenacin . He denied pain, fevers, chills, dizziness and shortness of breath but did have complaints of low energy levels. I was able to flush the foley catheter without difficulty and he was instructed to maintain the foley catheter until his follow up with Dr. Dulcy Gibney 06/17/22. The next day, he called to say the catheter fell out spontaneously, but he was then able to void independently.   He reports significant improvement in his urinary symptoms. He reports less frequency and urgency. His nocturia has decreased. He states that he has a much improved quality of life and is able to get back to traveling and doing things he's interested in with less obsession about finding a bathroom. He has had no hematuria since the catheter came out. He remains on Flomax .  At our last clinic visit 10/18/22 he reported doing well. His symptoms continued to improve and he denied hematuria or UTIs. He remained on Flomax . We made plans to follow up in 6 months and he presents today via virtual video visit. Dr. Dulcy Gibney ordered an MR prostate for elevated PSA levels and this study was completed 06/18/23.    Overall he is doing well with significant improvement in symptoms since prior to PAE. He is now off flomax .       Past Medical History:  Diagnosis Date  Anxiety    Arthritis    Cataract    right eye   Colon polyps    Elevated PSA    History of kidney stones    over 10 yrs ago   Hypertension  12-20-10   tx. Lotrel   Hypertrophy of prostate without urinary obstruction and other lower urinary tract symptoms (LUTS)    Lipoma of neck October 2012   posterior neck lesion    Other malaise and fatigue    Tubular adenoma 09/2010    Past Surgical History:  Procedure Laterality Date   ANAL RECTAL MANOMETRY N/A 08/06/2019   Procedure: ANO RECTAL MANOMETRY;  Surgeon: Baldo Bonds, MD;  Location: [facility];  Service: Endoscopy;  Laterality: N/A;   IR ANGIOGRAM PELVIS SELECTIVE OR SUPRASELECTIVE  06/06/2022   IR ANGIOGRAM SELECTIVE EACH ADDITIONAL VESSEL  06/06/2022   IR ANGIOGRAM SELECTIVE EACH ADDITIONAL VESSEL  06/06/2022   IR CT SPINE LTD  06/06/2022   IR EMBO ARTERIAL NOT HEMORR HEMANG INC GUIDE ROADMAPPING  06/06/2022   IR EMBO ARTERIAL NOT HEMORR HEMANG INC GUIDE ROADMAPPING  06/06/2022   IR RADIOLOGIST EVAL & MGMT  05/18/2022   IR RADIOLOGIST EVAL & MGMT  06/14/2022   IR RADIOLOGIST EVAL & MGMT  07/09/2022   IR RADIOLOGIST EVAL & MGMT  10/18/2022   IR US  GUIDE VASC ACCESS LEFT  06/06/2022   KNEE ARTHROCENTESIS  1989   left knee -scope   MASS EXCISION  12/24/2010   Procedure: EXCISION MASS;  Surgeon: Kari Otto. 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  Medications: Prior to Admission medications   Medication Sig Start Date End Date Taking? Authorizing Provider  amLODipine -benazepril  (LOTREL) 5-10 MG per capsule Take 1 capsule by mouth daily.    [provider]  cephALEXin  (KEFLEX ) 500 MG capsule Take 1 capsule (500 mg total) by mouth 3 (three) times daily. 07/31/20   Tonya Fredrickson, MD  ondansetron  (ZOFRAN  ODT) 4 MG disintegrating  tablet Take 1 tablet (4 mg total) by mouth every 8 (eight) hours as needed for nausea or vomiting. 01/25/20   Hall-Potvin, [place], PA-C  prochlorperazine  (COMPAZINE ) 10 MG tablet Take 1 tablet (10 mg total) by mouth every 6 (six) hours as needed for nausea or vomiting (or headache). 01/26/20   Alissa April, MD  solifenacin  (VESICARE ) 5 MG tablet Take 1 tablet (5 mg total) by mouth daily. 06/06/22   Norene Beards, NP  tamsulosin  (FLOMAX ) 0.4 MG CAPS capsule Take 0.4 mg by mouth at bedtime.    [provider]     Family History  Problem Relation Age of Onset   Cancer Mother     Social History   Socioeconomic History   Marital status: Single    Spouse name: Not on file   Number of children: Not on file   Years of education: Not on file   Highest education level: Not on file  Occupational History   Not on file  Tobacco Use   Smoking status: Never   Smokeless tobacco: Never  Vaping Use   Vaping status: Never Used  Substance and Sexual Activity  Alcohol use: Yes    Comment: 1 glass per week   Drug use: No   Sexual activity: Not on file  Other Topics Concern   Not on file  Social History Narrative   Not on file   Social Drivers of Health   Financial Resource Strain: Not on file  Food Insecurity: Not on file  Transportation Needs: Not on file  Physical Activity: Not on file  Stress: Not on file  Social Connections: Not on file     Vital Signs: There were no vitals taken for this visit.  Physical Exam  Patient is alert, oriented and able to participate fully in the conversation. No apparent discomfort or distress observed. He appears appropriately dressed.    Imaging:  MRI Prostate 01/06/13   7.7 x 6.0 x 4.5 cm = 109 g   CTA Pelvis 05/27/22  11.0 x 7.6 x 6.7 cm = 280 g   PAE 06/06/22 IMPRESSION: 1. Prostatomegaly with left prostatic artery dominant supply to the prostate gland. 2. Coil embolization of the right inferior rectal artery  secondary to showed supply with a right prostatic artery for embolic protection purposes. 3. Technically successful particle embolization of the bilateral prostatic arteries.    MRI Prostate 10/18/22  8.1 x 5.1 x 7.1 cm = 148 g (~47% volume reduction)  Labs:  CBC: No results for input(s): "WBC", "HGB", "HCT", "PLT" in the last 8760 hours.  COAGS: No results for input(s): "INR", "APTT" in the last 8760 hours.  BMP: No results for input(s): "NA", "K", "CL", "CO2", "GLUCOSE", "BUN", "CALCIUM", "CREATININE", "GFRNONAA", "GFRAA" in the last 8760 hours.  Invalid input(s): "CMP"  LIVER FUNCTION TESTS: No results for input(s): "BILITOT", "AST", "ALT", "ALKPHOS", "PROT", "ALBUMIN" in the last 8760 hours.  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 He had a massively enlarged prostate, 280 g pre-procedure. He is now s/p bilateral prostate artery particle embolization 05/18/22. This included coil embolization of the right inferior rectal artery due to origination from the right prostatic artery for embolization protection purposes. Post procedure he developed urinary retention secondary to blood clots and he required Foley catheter placement. His Foley was accidentally removed and left out due to ability to void independently. His LUTS significantly improved since the procedure and he is very satisfied with the results so far. MRI prostate at 4 months demonstrated an approximate 47% prostate volume reduction.   He continues to do well 1 year after PAE.  Follow up in 1 year in IR clinic, no imaging required.   Creasie Doctor, MD Pager: [phone]    I spent a total of 25 Minutes in virtual video clinical consultation, greater than 50% of which was counseling/coordinating care for benign prostatic hyperplasia

## 2023-07-04 ENCOUNTER — Ambulatory Visit
Admission: RE | Admit: 2023-07-04 | Discharge: 2023-07-04 | Disposition: A | Discharge status: Home / Self Care | Source: Ambulatory Visit | Attending: Interventional Radiology | Admitting: Interventional Radiology

## 2023-07-04 DIAGNOSIS — R350 Frequency of micturition: Secondary | ICD-10-CM | POA: Diagnosis not present

## 2023-07-04 HISTORY — PX: IR RADIOLOGIST EVAL & MGMT: IMG5224

## 2023-08-11 DIAGNOSIS — I10 Essential (primary) hypertension: Secondary | ICD-10-CM | POA: Diagnosis not present

## 2023-09-11 DIAGNOSIS — I10 Essential (primary) hypertension: Secondary | ICD-10-CM | POA: Diagnosis not present

## 2023-10-12 DIAGNOSIS — I10 Essential (primary) hypertension: Secondary | ICD-10-CM | POA: Diagnosis not present

## 2023-11-11 DIAGNOSIS — I10 Essential (primary) hypertension: Secondary | ICD-10-CM | POA: Diagnosis not present

## 2023-12-05 ENCOUNTER — Other Ambulatory Visit (HOSPITAL_COMMUNITY): Payer: Self-pay | Admitting: Urology

## 2023-12-05 DIAGNOSIS — C61 Malignant neoplasm of prostate: Secondary | ICD-10-CM

## 2023-12-08 ENCOUNTER — Telehealth: Payer: Self-pay | Admitting: Radiation Oncology

## 2023-12-08 DIAGNOSIS — Z23 Encounter for immunization: Secondary | ICD-10-CM | POA: Diagnosis not present

## 2023-12-08 DIAGNOSIS — R490 Dysphonia: Secondary | ICD-10-CM | POA: Diagnosis not present

## 2023-12-08 NOTE — Telephone Encounter (Signed)
 Left message for patient to call back to schedule consult per 10/21 referral.

## 2023-12-16 ENCOUNTER — Encounter (HOSPITAL_COMMUNITY)
Admission: RE | Admit: 2023-12-16 | Discharge: 2023-12-16 | Disposition: A | Discharge status: Home / Self Care | Source: Ambulatory Visit | Attending: Urology | Admitting: Urology

## 2023-12-16 DIAGNOSIS — C61 Malignant neoplasm of prostate: Secondary | ICD-10-CM | POA: Insufficient documentation

## 2023-12-16 MED ORDER — FLOTUFOLASTAT F 18 GALLIUM 296-5846 MBQ/ML IV SOLN
8.3000 | Freq: Once | INTRAVENOUS | Status: AC
  Administered 2023-12-16: 8.3 via INTRAVENOUS

## 2023-12-17 NOTE — Progress Notes (Signed)
 GU Location of Tumor / Histology: Prostate Ca  If Prostate Cancer, Gleason Score is (4 + 4) and PSA is (17.68 on 10/09/2023)  Danny Cobb presented as referral from Dr. Morene MICAEL Salines [facility]) elevated PSA.  Biopsies      12/16/2023 Dr. Morene MICAEL Salines NM PET (PSMA) Skull to Mid Thigh CLINICAL DATA:  Elevated PSA, prostate cancer  IMPRESSION: 1. Focus of intense radiotracer activity in the posterior right prostate gland consistent with prostate cancer. 2. No PSMA-avid pelvic, abdominal, or mediastinal lymph nodes. 3. No skeletal metastases.   06/18/2023 Dr. Morene MICAEL Salines MR Prostate with/without Contrast CLINICAL DATA:  Elevated PSA level. R97.20   IMPRESSION: 1. PI-RADS category 4 lesion of the right posterolateral peripheral zone at the apex. 2. Prostatomegaly and benign prostatic hypertrophy. 3. Sigmoid colon diverticulosis. 4. Mildly enlarged left external iliac node, stable compared to 05/27/2022, but increased in size compared to 01/06/2013.  Past/Anticipated interventions by urology, if any:   Dr. Morene Salines   Past/Anticipated interventions by medical oncology, if any: NA  Weight changes, if any:   No  IPSS:  9 SHIM:  11  Bowel/Bladder complaints, if any:  No  Nausea/Vomiting, if any: No  Pain issues, if any:  0/10  SAFETY ISSUES: Prior radiation?  No Pacemaker/ICD? No Possible current pregnancy? Male Is the patient on methotrexate? No  Current Complaints / other details:  None  30 minutes spent total, including time for meaningful use questions, reviewing medication, as well as spent in face-to-face time in nurse evaluation with the patient.

## 2023-12-21 ENCOUNTER — Encounter: Payer: Self-pay | Admitting: Urology

## 2023-12-21 DIAGNOSIS — C61 Malignant neoplasm of prostate: Secondary | ICD-10-CM | POA: Insufficient documentation

## 2023-12-21 NOTE — Progress Notes (Signed)
 Radiation Oncology         [phone] ________________________________  Initial Outpatient Consultation  Name: Danny Cobb MRN: [id number]  Date: 12/22/2023  DOB: 1946/01/27  RR:Enopuz, Tanda, MD  Cam Morene ORN, MD   REFERRING PHYSICIAN: Cam Morene ORN, MD  DIAGNOSIS: 78 y.o. gentleman with Stage T1c adenocarcinoma of the prostate with Gleason score of 4+4, and PSA of 17.68.    ICD-10-CM   1. Malignant neoplasm of prostate (HCC)  C61       HISTORY OF PRESENT ILLNESS: Danny Cobb is a 78 y.o. male with a diagnosis of prostate cancer. He has been followed by urology for many years for BPH with BOO and an elevated PSA. He previously underwent a prostate biopsy in 2010 for a PSA of 9.76, and results were negative. Of note, a surveillance prostate MRI in 2014 was negative for any high grade lesions. Since that time, his PSA has fluctuated, as have his voiding symptoms. He has had intermittent AUR requiring CIC but ultimately underwent prostatic artery embolization (TACE) in 07/2022, which has relieved his prior symptoms. His PSA has fluctuated from as low as 9.4 in 07/2018 to as high as 17.1. Following the TACE procedure, his PSA initially returned to 9.4 in 10/2022 but rose again to 15.8 in 04/2023. He underwent a prostate MRI on 06/18/23 showing a PI-RADS 4 lesion in the right posterolateral peripheral zone at the apex and a mildly enlarged left external iliac node, stable compared to 05/2022. A repeat PSA was obtained and sent for 4Kscore test in 09/2023. The PSA had increased to 17.68, and the 4K score was 24.4 (out of 100), indicating a high probability for aggressive prostate cancer. The patient proceeded to MRI fusion biopsy of the prostate on 11/24/23.  The prostate volume measured 319 cc.  Out of 15 core biopsies, 3 were positive.  The maximum Gleason score was 4+4, and this was seen in 1 of 3 samples from the MRI ROI as well as in the right lateral mid, and left lateral  mid.  He underwent a staging PSMA PET scan on 12/16/23 showing no evidence of disease outside of the prostate.  The patient reviewed the biopsy and imaging results with his urologist and he has kindly been referred today for discussion of potential radiation treatment options.   PREVIOUS RADIATION THERAPY: No  PAST MEDICAL HISTORY:  Past Medical History:  Diagnosis Date   Anxiety    Arthritis    Cataract    right eye   Colon polyps    Elevated PSA    History of kidney stones    over 10 yrs ago   Hypertension 12/20/2010   tx. Lotrel   Hypertrophy of prostate without urinary obstruction and other lower urinary tract symptoms (LUTS)    Lipoma of neck 11/2010   posterior neck lesion    Other malaise and fatigue    Prostate cancer (HCC)    Tubular adenoma 09/2010      PAST SURGICAL HISTORY: Past Surgical History:  Procedure Laterality Date   ANAL RECTAL MANOMETRY N/A 08/06/2019   Procedure: ANO RECTAL MANOMETRY;  Surgeon: Dianna Specking, MD;  Location: [facility];  Service: Endoscopy;  Laterality: N/A;   IR ANGIOGRAM PELVIS SELECTIVE OR SUPRASELECTIVE  06/06/2022   IR ANGIOGRAM SELECTIVE EACH ADDITIONAL VESSEL  06/06/2022   IR ANGIOGRAM SELECTIVE EACH ADDITIONAL VESSEL  06/06/2022   IR CT SPINE LTD  06/06/2022   IR EMBO ARTERIAL NOT HEMORR HEMANG INC GUIDE ROADMAPPING  06/06/2022   IR EMBO ARTERIAL NOT HEMORR HEMANG INC GUIDE ROADMAPPING  06/06/2022   IR RADIOLOGIST EVAL & MGMT  05/18/2022   IR RADIOLOGIST EVAL & MGMT  06/14/2022   IR RADIOLOGIST EVAL & MGMT  07/09/2022   IR RADIOLOGIST EVAL & MGMT  10/18/2022   IR RADIOLOGIST EVAL & MGMT  07/04/2023   IR US  GUIDE VASC ACCESS LEFT  06/06/2022   KNEE ARTHROCENTESIS  1989   left knee -scope   MASS EXCISION  12/24/2010   Procedure: EXCISION MASS;  Surgeon: Donnice POUR. Tsuei, MD;  Location: [facility];  Service: General;  Laterality: N/A;  Excision of Sebaceous Masses   PROSTATE SURGERY  2010   prostate biopsy-last done    TONSILLECTOMY      TOTAL KNEE ARTHROPLASTY Left 03/01/2016   Procedure: TOTAL KNEE ARTHROPLASTY;  Surgeon: Norleen Gavel, MD;  Location: [facility];  Service: Orthopedics;  Laterality: Left;    FAMILY HISTORY:  Family History  Problem Relation Age of Onset   Cancer Mother     SOCIAL HISTORY:  Social History   Socioeconomic History   Marital status: Single    Spouse name: Not on file   Number of children: Not on file   Years of education: Not on file   Highest education level: Not on file  Occupational History   Not on file  Tobacco Use   Smoking status: Never   Smokeless tobacco: Never  Vaping Use   Vaping status: Never Used  Substance and Sexual Activity   Alcohol use: Not Currently    Comment: 1 glass per week   Drug use: No   Sexual activity: Not Currently  Other Topics Concern   Not on file  Social History Narrative   Not on file   Social Drivers of Health   Financial Resource Strain: Not on file  Food Insecurity: No Food Insecurity (12/22/2023)   Hunger Vital Sign    Worried About Running Out of Food in the Last Year: Never true    Ran Out of Food in the Last Year: Never true  Transportation Needs: No Transportation Needs (12/22/2023)   PRAPARE - Administrator, Civil Service (Medical): No    Lack of Transportation (Non-Medical): No  Physical Activity: Not on file  Stress: Not on file  Social Connections: Not on file  Intimate Partner Violence: Not At Risk (12/22/2023)   Humiliation, Afraid, Rape, and Kick questionnaire    Fear of Current or Ex-Partner: No    Emotionally Abused: No    Physically Abused: No    Sexually Abused: No    ALLERGIES: Patient has no known allergies.  MEDICATIONS:  Current Outpatient Medications  Medication Sig Dispense Refill   relugolix (ORGOVYX) 120 MG tablet Take 120 mg by mouth daily.     amLODipine -benazepril  (LOTREL) 5-10 MG per capsule Take 1 capsule by mouth daily.     No current facility-administered medications for this  encounter.    REVIEW OF SYSTEMS:  On review of systems, the patient reports that he is doing well overall. He denies any chest pain, shortness of breath, cough, fevers, chills, night sweats, unintended weight changes. He denies any bowel disturbances, and denies abdominal pain, nausea or vomiting. He denies any new musculoskeletal or joint aches or pains. His IPSS was 9, indicating mild-moderate urinary symptoms. His SHIM was 11, indicating he has moderate erectile dysfunction. A complete review of systems is obtained and is otherwise negative.    PHYSICAL EXAM:  Wt Readings from Last 3 Encounters:  12/22/23 228 lb 3.2 oz (103.5 kg)  06/12/22 212 lb (96.2 kg)  06/06/22 214 lb (97.1 kg)   Temp Readings from Last 3 Encounters:  12/22/23 (!) 97.3 F (36.3 C)  06/12/22 97.7 F (36.5 C) (Oral)  06/08/22 98.4 F (36.9 C) (Oral)   BP Readings from Last 3 Encounters:  12/22/23 134/66  06/12/22 122/73  06/08/22 135/84   Pulse Readings from Last 3 Encounters:  12/22/23 71  06/12/22 97  06/08/22 76   Pain Assessment Pain Score: 0-No pain/10  In general this is a well appearing African American male in no acute distress. He's alert and oriented x4 and appropriate throughout the examination. Cardiopulmonary assessment is negative for acute distress, and he exhibits normal effort.     KPS = 100  100 - Normal; no complaints; no evidence of disease. 90   - Able to carry on normal activity; minor signs or symptoms of disease. 80   - Normal activity with effort; some signs or symptoms of disease. 84   - Cares for self; unable to carry on normal activity or to do active work. 60   - Requires occasional assistance, but is able to care for most of his personal needs. 50   - Requires considerable assistance and frequent medical care. 40   - Disabled; requires special care and assistance. 30   - Severely disabled; hospital admission is indicated although death not imminent. 20   - Very sick;  hospital admission necessary; active supportive treatment necessary. 10   - Moribund; fatal processes progressing rapidly. 0     - Dead  Karnofsky DA, Abelmann WH, Craver LS and Burchenal JH [phone]) The use of the nitrogen mustards in the palliative treatment of carcinoma: with particular reference to bronchogenic carcinoma Cancer 1 [phone]  LABORATORY DATA:  Lab Results  Component Value Date   WBC 6.0 06/06/2022   HGB 15.8 06/06/2022   HCT 46.0 06/06/2022   MCV 92.0 06/06/2022   PLT 217 06/06/2022   Lab Results  Component Value Date   NA 138 06/12/2022   K 3.2 (L) 06/12/2022   CL 106 06/12/2022   CO2 23 06/12/2022   Lab Results  Component Value Date   ALT 21 01/25/2020   AST 19 01/25/2020   ALKPHOS 62 01/25/2020   BILITOT 1.3 (H) 01/25/2020     RADIOGRAPHY: NM PET (PSMA) SKULL TO MID THIGH Result Date: 12/19/2023 EXAM: PROSTATE PET SKULL BASE TO MID THIGHS 12/16/2023 04:26:39 PM TECHNIQUE: RADIOPHARMACEUTICAL: 8.3 mCi F-18 piflufolastat (Pylarify) injected intravenously. PET imaging was obtained from skull vertex to mid thighs. Computed tomography was used for attenuation correction and localization. Fusion imaging was obtained. COMPARISON: None available. CLINICAL HISTORY: FINDINGS: PROSTATE AND PROSTATE BED: Focus of intense radiotracer activity in the posterior right aspect of the prostate gland with SUV max equals 6.2 on image 189. The prostate gland is enlarged. LYMPH NODES: No PSMA avid pelvic lymph nodes. No PSMA avid abdominal lymph nodes. No PSMA avid mediastinal lymph nodes. BONES: Review of the spine demonstrates multiple endplate Schmorl's nodes which are large. No radiotracer activity. LUNGS: No suspicious pulmonary nodules. OTHER PET FINDINGS: Physiologic activity within the salivary glands, liver, spleen, kidneys, bowel, and urinary bladder. CT findings multiple gallstones. IMPRESSION: 1. Focus of intense radiotracer activity in the posterior right prostate gland  consistent with prostate cancer. 2. No PSMA-avid pelvic, abdominal, or mediastinal lymph nodes. 3. No skeletal metastases. Electronically signed by: Norleen Boxer MD  12/19/2023 04:03 PM EST RP Workstation: [id number]      IMPRESSION/PLAN: 1. 78 y.o. gentleman with Stage T1c adenocarcinoma of the prostate with Gleason Score of 4+4, and PSA of 17.68. We discussed the patient's workup and outlined the nature of prostate cancer in this setting. The patient's T stage, Gleason's score, and PSA put him into the high risk group. Accordingly, he is eligible for  LT-ADT concurrent with 8 weeks of external radiation. He is not an ideal surgical candidate given his advanced age. We discussed the available radiation techniques, and focused on the details and logistics of delivery. The patient is not a candidate for brachytherapy boost with a prostate volume of 319 Gm. Therefore, we discussed and outlined the risks, benefits, short and long-term effects associated with daily external beam radiotherapy and compared and contrasted these with prostatectomy. We discussed the role of SpaceOAR gel in reducing the rectal toxicity associated with radiotherapy. We also detailed the role of ADT in the treatment of high risk prostate cancer and outlined the associated side effects that could be expected with this therapy. He has already started taking Orogvyx ADT and feel that he is tolerating this relatively well aside from some hot flashes and decreased libido. He was encouraged to ask questions that were answered to his stated satisfaction.  At the conclusion of our conversation, the patient is interested in moving forward with 8 weeks of external beam therapy concurrent with ADT. He has started ADT on 12/06/23 so we will share our discussion with Dr. Cam and make arrangements for fiducial markers and SpaceOAR gel placement in late Dec. 2025 or early Jan. 2026, prior to simulation, to reduce rectal toxicity from radiotherapy.  The patient appears to have a good understanding of his disease and our treatment recommendations which are of curative intent and is in agreement with the stated plan.  Therefore, we will move forward with treatment planning accordingly, in anticipation of beginning IMRT in Jan. 2026, approximately 2 months after starting ADT. We enjoyed meeting him today and look forward to continuing to participate in his care.  We personally spent 70 minutes in this encounter including chart review, reviewing radiological studies, meeting face-to-face with the patient, entering orders and completing documentation.    Sabra MICAEL Rusk, PA-C    Donnice Barge, MD  [facility]  Radiation Oncology Direct Dial: [phone]  Fax: [phone] [email or web address]  Skype  [email or web address]   This document serves as a record of services personally performed by Donnice Barge, MD and Sabra Rusk, PA-C. It was created on their behalf by Izetta Neither, a trained medical scribe. The creation of this record is based on the scribe's personal observations and the provider's statements to them. This document has been checked and approved by the attending provider.

## 2023-12-22 ENCOUNTER — Encounter: Payer: Self-pay | Admitting: Radiation Oncology

## 2023-12-22 ENCOUNTER — Ambulatory Visit
Admission: RE | Admit: 2023-12-22 | Discharge: 2023-12-22 | Disposition: A | Discharge status: Home / Self Care | Source: Ambulatory Visit | Attending: Radiation Oncology | Admitting: Radiation Oncology

## 2023-12-22 VITALS — BP 134/66 | HR 71 | Temp 97.3°F | Resp 20 | Ht 68.0 in | Wt 228.2 lb

## 2023-12-22 DIAGNOSIS — M129 Arthropathy, unspecified: Secondary | ICD-10-CM | POA: Diagnosis not present

## 2023-12-22 DIAGNOSIS — Z79899 Other long term (current) drug therapy: Secondary | ICD-10-CM | POA: Diagnosis not present

## 2023-12-22 DIAGNOSIS — Z87442 Personal history of urinary calculi: Secondary | ICD-10-CM | POA: Diagnosis not present

## 2023-12-22 DIAGNOSIS — Z860101 Personal history of adenomatous and serrated colon polyps: Secondary | ICD-10-CM | POA: Diagnosis not present

## 2023-12-22 DIAGNOSIS — Z809 Family history of malignant neoplasm, unspecified: Secondary | ICD-10-CM | POA: Diagnosis not present

## 2023-12-22 DIAGNOSIS — I10 Essential (primary) hypertension: Secondary | ICD-10-CM | POA: Insufficient documentation

## 2023-12-22 DIAGNOSIS — C61 Malignant neoplasm of prostate: Secondary | ICD-10-CM

## 2023-12-22 HISTORY — DX: Malignant neoplasm of prostate: C61

## 2023-12-22 NOTE — Progress Notes (Signed)
 Introduced myself to the patient as the prostate nurse navigator. He is here to discuss his radiation treatment options and will proceed with ADT and 8 weeks of IMRT. I provided patient with some written education and my direct contact information.  Patient knows to reach out with any questions or barriers that may arise.

## 2023-12-23 ENCOUNTER — Other Ambulatory Visit: Payer: Self-pay | Admitting: Urology

## 2023-12-24 ENCOUNTER — Other Ambulatory Visit: Payer: Self-pay | Admitting: Urology

## 2023-12-24 DIAGNOSIS — C61 Malignant neoplasm of prostate: Secondary | ICD-10-CM

## 2024-01-22 ENCOUNTER — Encounter (HOSPITAL_COMMUNITY): Payer: Self-pay | Admitting: Urology

## 2024-01-22 ENCOUNTER — Encounter (HOSPITAL_COMMUNITY): Payer: Self-pay

## 2024-01-22 NOTE — Progress Notes (Signed)
 Spoke w/ via phone for pre-op interview--- [facility] needs dos---- BMP and EKG per anesthesia        Lab results------ COVID test -----patient states asymptomatic no test needed Arrive at -------0730 NPO after MN NO Solid Food.   Pre-Surgery Ensure or G2:  Med rec completed Medications to take morning of surgery -----Orgovyx Diabetic medication -----  GLP1 agonist last dose: GLP1 instructions:  Patient instructed no nail polish to be worn day of surgery Patient instructed to bring photo id and insurance card day of surgery Patient aware to have Driver (ride ) / caregiver    for 24 hours after surgery - Friend Fidela Arenas Patient Special Instructions -----Fleet enema per surgeons instructions. Pre-Op special Instructions -----  Patient verbalized understanding of instructions that were given at this phone interview. Patient denies chest pain, sob, fever, cough at the interview.

## 2024-01-22 NOTE — Progress Notes (Signed)
 RN left message for call back to review any questions or new barriers prior to upcoming fiducial marker's, spaceOAR gel, and CT Simulation.

## 2024-01-30 ENCOUNTER — Telehealth: Payer: Self-pay | Admitting: *Deleted

## 2024-01-30 ENCOUNTER — Other Ambulatory Visit: Payer: Self-pay

## 2024-01-30 ENCOUNTER — Ambulatory Visit (HOSPITAL_COMMUNITY)

## 2024-01-30 ENCOUNTER — Encounter (HOSPITAL_COMMUNITY): Payer: Self-pay | Admitting: Urology

## 2024-01-30 ENCOUNTER — Ambulatory Visit (HOSPITAL_COMMUNITY)
Admission: RE | Admit: 2024-01-30 | Discharge: 2024-01-30 | Disposition: A | Discharge status: Home / Self Care | Attending: Urology | Admitting: Urology

## 2024-01-30 ENCOUNTER — Encounter: Admission: RE | Disposition: A | Payer: Self-pay | Attending: Urology

## 2024-01-30 DIAGNOSIS — C61 Malignant neoplasm of prostate: Secondary | ICD-10-CM

## 2024-01-30 DIAGNOSIS — I10 Essential (primary) hypertension: Secondary | ICD-10-CM | POA: Insufficient documentation

## 2024-01-30 DIAGNOSIS — F419 Anxiety disorder, unspecified: Secondary | ICD-10-CM

## 2024-01-30 DIAGNOSIS — M1990 Unspecified osteoarthritis, unspecified site: Secondary | ICD-10-CM | POA: Insufficient documentation

## 2024-01-30 HISTORY — PX: GOLD SEED IMPLANT: SHX6343

## 2024-01-30 HISTORY — PX: SPACE OAR INSTILLATION: SHX6769

## 2024-01-30 LAB — BASIC METABOLIC PANEL WITH GFR
Anion gap: 11 (ref 5–15)
BUN: 16 mg/dL (ref 8–23)
CO2: 22 mmol/L (ref 22–32)
Calcium: 9.8 mg/dL (ref 8.9–10.3)
Chloride: 107 mmol/L (ref 98–111)
Creatinine, Ser: 1.09 mg/dL (ref 0.61–1.24)
GFR, Estimated: >60 mL/min
Glucose, Bld: 103 mg/dL — ABNORMAL HIGH (ref 70–99)
Potassium: 4.4 mmol/L (ref 3.5–5.1)
Sodium: 140 mmol/L (ref 135–145)

## 2024-01-30 SURGERY — INSERTION, GOLD SEEDS
Anesthesia: Monitor Anesthesia Care | Site: Prostate

## 2024-01-30 MED ORDER — FENTANYL CITRATE (PF) 100 MCG/2ML IJ SOLN
INTRAMUSCULAR | Status: AC
  Filled 2024-01-30: qty 2

## 2024-01-30 MED ORDER — SODIUM CHLORIDE 0.9% FLUSH: 3.0000 mL | INTRAVENOUS | Status: DC | PRN

## 2024-01-30 MED ORDER — PROPOFOL 10 MG/ML IV BOLUS
INTRAVENOUS | Status: AC
  Filled 2024-01-30: qty 20

## 2024-01-30 MED ORDER — LACTATED RINGERS IV SOLN: INTRAVENOUS | Status: DC

## 2024-01-30 MED ORDER — ACETAMINOPHEN 650 MG RE SUPP
650.0000 mg | RECTAL | Status: DC | PRN
  Filled 2024-01-30: qty 1

## 2024-01-30 MED ORDER — CEFAZOLIN SODIUM-DEXTROSE 2-4 GM/100ML-% IV SOLN
INTRAVENOUS | Status: AC
  Filled 2024-01-30: qty 100

## 2024-01-30 MED ORDER — LIDOCAINE 2% (20 MG/ML) 5 ML SYRINGE
INTRAMUSCULAR | Status: DC | PRN
  Administered 2024-01-30: 40 mg via INTRAVENOUS

## 2024-01-30 MED ORDER — PROPOFOL 10 MG/ML IV BOLUS
INTRAVENOUS | Status: DC | PRN
  Administered 2024-01-30 (×5): 40 mg via INTRAVENOUS

## 2024-01-30 MED ORDER — CHLORHEXIDINE GLUCONATE 0.12 % MT SOLN
15.0000 mL | Freq: Once | OROMUCOSAL | Status: AC
  Administered 2024-01-30: 15 mL via OROMUCOSAL

## 2024-01-30 MED ORDER — SODIUM CHLORIDE (PF) 0.9 % IJ SOLN
INTRAMUSCULAR | Status: DC | PRN
  Administered 2024-01-30: 10 mL

## 2024-01-30 MED ORDER — ACETAMINOPHEN 325 MG PO TABS: 650.0000 mg | ORAL_TABLET | ORAL | Status: DC | PRN

## 2024-01-30 MED ORDER — OXYCODONE HCL 5 MG/5ML PO SOLN: 5.0000 mg | Freq: Once | ORAL | Status: DC | PRN

## 2024-01-30 MED ORDER — CEFAZOLIN SODIUM-DEXTROSE 2-4 GM/100ML-% IV SOLN
2.0000 g | INTRAVENOUS | Status: AC
  Administered 2024-01-30: 2 g via INTRAVENOUS

## 2024-01-30 MED ORDER — DEXMEDETOMIDINE HCL IN NACL 80 MCG/20ML IV SOLN
INTRAVENOUS | Status: DC | PRN
  Administered 2024-01-30 (×2): 4 ug via INTRAVENOUS

## 2024-01-30 MED ORDER — ACETAMINOPHEN 10 MG/ML IV SOLN: 1000.0000 mg | Freq: Once | INTRAVENOUS | Status: DC | PRN

## 2024-01-30 MED ORDER — SODIUM CHLORIDE 0.9 % IV SOLN: 250.0000 mL | INTRAVENOUS | Status: DC | PRN

## 2024-01-30 MED ORDER — CHLORHEXIDINE GLUCONATE 0.12 % MT SOLN
OROMUCOSAL | Status: AC
  Filled 2024-01-30: qty 15

## 2024-01-30 MED ORDER — OXYCODONE HCL 5 MG PO TABS: 5.0000 mg | ORAL_TABLET | ORAL | Status: DC | PRN

## 2024-01-30 MED ORDER — SODIUM CHLORIDE 0.9% FLUSH: 3.0000 mL | Freq: Two times a day (BID) | INTRAVENOUS | Status: DC

## 2024-01-30 MED ORDER — ORAL CARE MOUTH RINSE: 15.0000 mL | Freq: Once | OROMUCOSAL | Status: AC

## 2024-01-30 MED ORDER — AMISULPRIDE (ANTIEMETIC) 5 MG/2ML IV SOLN: 10.0000 mg | Freq: Once | INTRAVENOUS | Status: DC | PRN

## 2024-01-30 MED ORDER — FENTANYL CITRATE (PF) 100 MCG/2ML IJ SOLN: 25.0000 ug | INTRAMUSCULAR | Status: DC | PRN

## 2024-01-30 MED ORDER — LIDOCAINE HCL 2 % IJ SOLN
INTRAMUSCULAR | Status: DC | PRN
  Administered 2024-01-30: 10 mL

## 2024-01-30 MED ORDER — LIDOCAINE 2% (20 MG/ML) 5 ML SYRINGE
INTRAMUSCULAR | Status: AC
  Filled 2024-01-30: qty 5

## 2024-01-30 MED ORDER — MORPHINE SULFATE (PF) 2 MG/ML IV SOLN: 2.0000 mg | INTRAVENOUS | Status: DC | PRN

## 2024-01-30 MED ORDER — ONDANSETRON HCL 4 MG/2ML IJ SOLN: 4.0000 mg | Freq: Once | INTRAMUSCULAR | Status: DC | PRN

## 2024-01-30 MED ORDER — OXYCODONE HCL 5 MG PO TABS: 5.0000 mg | ORAL_TABLET | Freq: Once | ORAL | Status: DC | PRN

## 2024-01-30 SURGICAL SUPPLY — 21 items
BLADE CLIPPER SENSICLIP SURGIC (BLADE) ×1 IMPLANT
CNTNR URN SCR LID CUP LEK RST (MISCELLANEOUS) ×1 IMPLANT
COVER BACK TABLE 60X90IN (DRAPES) ×1 IMPLANT
DRSG TEGADERM 4X4.75 (GAUZE/BANDAGES/DRESSINGS) ×1 IMPLANT
DRSG TEGADERM 8X12 (GAUZE/BANDAGES/DRESSINGS) ×1 IMPLANT
GAUZE SPONGE 4X4 12PLY STRL (GAUZE/BANDAGES/DRESSINGS) ×1 IMPLANT
GLOVE SURG ORTHO 8.5 STRL (GLOVE) ×1 IMPLANT
GLOVE SURG SS PI 8.0 STRL IVOR (GLOVE) ×1 IMPLANT
IMPL SPACEOAR SYSTEM 10ML (Spacer) ×1 IMPLANT
MARKER GOLD PRELOAD 1.2X3 (Urological Implant) ×1 IMPLANT
MARKER SKIN DUAL TIP RULER LAB (MISCELLANEOUS) ×1 IMPLANT
NDL SPNL 22GX3.5 QUINCKE BK (NEEDLE) ×1 IMPLANT
NEEDLE SPNL 22GX3.5 QUINCKE BK (NEEDLE) ×1 IMPLANT
SHEATH ULTRASOUND LF (SHEATH) IMPLANT
SHEATH ULTRASOUND LTX NONSTRL (SHEATH) IMPLANT
SLEEVE SCD COMPRESS KNEE MED (STOCKING) ×1 IMPLANT
SURGILUBE 2OZ TUBE FLIPTOP (MISCELLANEOUS) ×1 IMPLANT
SYR 10ML LL (SYRINGE) IMPLANT
SYR CONTROL 10ML LL (SYRINGE) ×1 IMPLANT
TOWEL OR 17X24 6PK STRL BLUE (TOWEL DISPOSABLE) ×1 IMPLANT
UNDERPAD 30X36 HEAVY ABSORB (UNDERPADS AND DIAPERS) ×1 IMPLANT

## 2024-01-30 NOTE — Interval H&P Note (Signed)
 History and Physical Interval Note: We discussed the procedure and risks.   He has no changes in his history.   01/30/2024 9:00 AM  Lyn[place] DEL Lichtenwalner  has presented today for surgery, with the diagnosis of PROSTATE CANCER.  The various methods of treatment have been discussed with the patient and family. After consideration of risks, benefits and other options for treatment, the patient has consented to  Procedures with comments: INSERTION, GOLD SEEDS (N/A) - GOLD SEED IMPLANTS AND SPACEOAR INJECTION, HYDROGEL SPACER (N/A) as a surgical intervention.  The patient's history has been reviewed, patient examined, no change in status, stable for surgery.  I have reviewed the patient's chart and labs.  Questions were answered to the patient's satisfaction.     Gagandeep Pettet

## 2024-01-30 NOTE — Transfer of Care (Signed)
 Immediate Anesthesia Transfer of Care Note  Patient: Danny Cobb  Procedure(s) Performed: INSERTION, GOLD SEEDS (Prostate) INJECTION, HYDROGEL SPACER (Prostate)  Patient Location: PACU  Anesthesia Type:MAC  Level of Consciousness: awake, alert , oriented, and patient cooperative  Airway & Oxygen Therapy: Patient Spontanous Breathing  Post-op Assessment: Report given to RN and Post -op Vital signs reviewed and stable  Post vital signs: Reviewed and stable  Last Vitals:  Vitals Value Taken Time  BP    Temp    Pulse    Resp    SpO2      Last Pain:  Vitals:   01/30/24 0754  TempSrc: Oral  PainSc: 0-No pain         Complications: There were no known notable events for this encounter.

## 2024-01-30 NOTE — Op Note (Signed)
 Procedure: 1.  Transrectal ultrasound-guided placement of fiducial markers. 2.  Transrectal ultrasound-guided placement of SpaceOAR gel.   Preop diagnosis: Prostate cancer.   Postop diagnosis: Same.   Surgeon: Dr. Norleen Seltzer.   Anesthesia: MAC.   Specimen: None   EBL: None.   Drain: None.   Complications: None. . Indications: The patient is a 78 year old male with history of prostate cancer he is to undergo radiation therapy and in preparation is to have fiducial markers and SpaceOAR gel placed.   Procedure: He was given ancef  for antibiotic coverage.  He was taken the operating room was placed in lithotomy position and given sedation as needed.  His perineum was clipped and the scrotum was reflected superiorly with an OpSite.  Perineum was prepped with Betadine solution.   The B and K transrectal ultrasound probe was assembled and inserted after generous lubrication.  The prostate was visualized in the sagittal and transverse planes.   The mid perineum was infiltrated with 10 mL of 2% lidocaine  from the skin level down to the prostatic apex.   The fiducial markers were then placed under ultrasound guidance into the right base medial prostate, the right apex and the left mid lateral prostate.   The SpaceOAR needle was then placed under ultrasound guidance into the post prostatic fat strip and advanced to the base of the prostate.  Saline was infiltrated and the space expanded appropriately.  No blood was returned with aspiration.   The SpaceOAR gel polymer was then instilled over a 12-second.  With excellent distribution underneath the prostate and no evidence of rectal infiltration.  The needle was then removed.   The ultrasound probe was removed and he was taken down from the lithotomy position and moved recovery room in stable condition after the wound was cleansed and a dressing was placed.

## 2024-01-30 NOTE — Telephone Encounter (Signed)
 RETURNED PATIENT'S PHONE CALL, SPOKE WITH PATIENT. ?

## 2024-01-30 NOTE — Anesthesia Procedure Notes (Signed)
 Procedure Name: MAC Date/Time: 01/30/2024 9:23 AM  Performed by: Erick Fitz, CRNAPre-anesthesia Checklist: Patient identified, Emergency Drugs available, Suction available, Patient being monitored and Timeout performed Patient Re-evaluated:Patient Re-evaluated prior to induction Oxygen Delivery Method: Simple face mask Preoxygenation: Pre-oxygenation with 100% oxygen Induction Type: IV induction Placement Confirmation: positive ETCO2 and CO2 detector Dental Injury: Teeth and Oropharynx as per pre-operative assessment

## 2024-01-30 NOTE — Anesthesia Postprocedure Evaluation (Signed)
"   Anesthesia Post Note  Patient: Danny Cobb  Procedure(s) Performed: INSERTION, GOLD SEEDS (Prostate) INJECTION, HYDROGEL SPACER (Prostate)     Patient location during evaluation: PACU Anesthesia Type: MAC Level of consciousness: awake Pain management: pain level controlled Vital Signs Assessment: post-procedure vital signs reviewed and stable Respiratory status: spontaneous breathing Cardiovascular status: blood pressure returned to baseline Postop Assessment: no apparent nausea or vomiting Anesthetic complications: no   There were no known notable events for this encounter.  Last Vitals:  Vitals:   01/30/24 1045 01/30/24 1100  BP: (!) 163/88 (!) 158/99  Pulse: (!) 58 (!) 56  Resp: 12 14  Temp:  36.6 C  SpO2: 97% 96%    Last Pain:  Vitals:   01/30/24 1100  TempSrc:   PainSc: Asleep                 Lucan Riner T Colhoun      "

## 2024-01-30 NOTE — Anesthesia Preprocedure Evaluation (Signed)
"                                    Anesthesia Evaluation  Patient identified by MRN, date of birth, ID band Patient awake    Reviewed: Allergy & Precautions, NPO status , Patient's Chart, lab work & pertinent test results  History of Anesthesia Complications Negative for: history of anesthetic complications  Airway Mallampati: II  TM Distance: >3 FB Neck ROM: Full    Dental  (+) Teeth Intact, Dental Advisory Given   Pulmonary neg pulmonary ROS   breath sounds clear to auscultation       Cardiovascular hypertension, Pt. on medications  Rhythm:Regular Rate:Normal     Neuro/Psych   Anxiety        GI/Hepatic Neg liver ROS,,,Hx of Colonic Polyps   Endo/Other  negative endocrine ROS    Renal/GU negative Renal ROS   BPH; Prostate Cancer    Musculoskeletal  (+) Arthritis ,    Abdominal   Peds  Hematology negative hematology ROS (+)   Anesthesia Other Findings   Reproductive/Obstetrics                              Anesthesia Physical Anesthesia Plan  ASA: 2  Anesthesia Plan: MAC   Post-op Pain Management:    Induction: Intravenous  PONV Risk Score and Plan: 1 and Treatment may vary due to age or medical condition and Propofol  infusion  Airway Management Planned: Natural Airway and Simple Face Mask  Additional Equipment: None  Intra-op Plan:   Post-operative Plan: Extubation in OR  Informed Consent: I have reviewed the patients History and Physical, chart, labs and discussed the procedure including the risks, benefits and alternatives for the proposed anesthesia with the patient or authorized representative who has indicated his/her understanding and acceptance.     Dental advisory given  Plan Discussed with: CRNA and Surgeon  Anesthesia Plan Comments:         Anesthesia Quick Evaluation  "

## 2024-01-30 NOTE — Telephone Encounter (Signed)
 Called patient to inform of sim appt. being moved to 02-11-24 due to the Dr. being out of the office, arrival time- 2:15 pm @ [facility], informed patient to arrive with a full bladder, patient to go to [facility] right sim for MRI, spoke with firiend Fidela Arenas and she  will have this patient to call me

## 2024-02-02 ENCOUNTER — Ambulatory Visit (HOSPITAL_COMMUNITY)

## 2024-02-02 ENCOUNTER — Encounter (HOSPITAL_COMMUNITY): Payer: Self-pay | Admitting: Urology

## 2024-02-02 ENCOUNTER — Ambulatory Visit: Admitting: Radiation Oncology

## 2024-02-10 ENCOUNTER — Telehealth: Payer: Self-pay | Admitting: *Deleted

## 2024-02-10 NOTE — Telephone Encounter (Signed)
 Called patient to remind of sim appt. for 02-11-24- arrival time- 9:15 am @ [facility], informed patient to arrive with a full bladder, reminded patient to arrive @ 3:45 pm @ [facility] on 02-11-24 for MRI, spoke with patient and he is aware of these appts. and the instructions

## 2024-02-11 ENCOUNTER — Ambulatory Visit (HOSPITAL_COMMUNITY)
Admission: RE | Admit: 2024-02-11 | Discharge: 2024-02-11 | Disposition: A | Discharge status: Home / Self Care | Source: Home / Self Care | Attending: Urology | Admitting: Urology

## 2024-02-11 ENCOUNTER — Ambulatory Visit
Admission: RE | Admit: 2024-02-11 | Discharge: 2024-02-11 | Disposition: A | Discharge status: Home / Self Care | Source: Ambulatory Visit | Attending: Radiation Oncology | Admitting: Radiation Oncology

## 2024-02-11 DIAGNOSIS — C61 Malignant neoplasm of prostate: Secondary | ICD-10-CM | POA: Insufficient documentation

## 2024-02-11 NOTE — Progress Notes (Signed)
" °  Radiation Oncology         [phone] ________________________________  Name: Danny Cobb MRN: [id number]  Date: 02/11/2024  DOB: November 25, 1945  SIMULATION AND TREATMENT PLANNING NOTE    ICD-10-CM   1. Malignant neoplasm of prostate (HCC)  C61       DIAGNOSIS:  78 y.o. gentleman with Stage T1c adenocarcinoma of the prostate with Gleason score of 4+4, and PSA of 17.68.   NARRATIVE:  The patient was brought to the CT Simulation planning suite.  Identity was confirmed.  All relevant records and images related to the planned course of therapy were reviewed.  The patient freely provided informed written consent to proceed with treatment after reviewing the details related to the planned course of therapy. The consent form was witnessed and verified by the simulation staff.  Then, the patient was set-up in a stable reproducible supine position for radiation therapy.  A vacuum lock pillow device was custom fabricated to position his legs in a reproducible immobilized position.  Then, I performed a urethrogram under sterile conditions to identify the prostatic apex.  CT images were obtained.  Surface markings were placed.  The CT images were loaded into the planning software.  Then the prostate target and avoidance structures including the rectum, bladder, bowel and hips were contoured.  Treatment planning then occurred.  The radiation prescription was entered and confirmed.  A total of one complex treatment devices was fabricated. I have requested : Intensity Modulated Radiotherapy (IMRT) is medically necessary for this case for the following reason:  Rectal sparing.SABRA  PLAN:   The prostate, seminal vesicles, and pelvic lymph nodes will initially be treated to 45 Gy in 25 fractions of 1.8 Gy followed by a boost to the prostate only, to 75 Gy with 15 additional fractions of 2.0 Gy; concurrent with ADT- started 12/06/23.  ________________________________  Donnice LABOR. Patrcia, M.D.  "

## 2024-02-23 ENCOUNTER — Ambulatory Visit
Admission: RE | Admit: 2024-02-23 | Discharge: 2024-02-23 | Disposition: A | Discharge status: Home / Self Care | Source: Ambulatory Visit | Attending: Radiation Oncology | Admitting: Radiation Oncology

## 2024-02-23 DIAGNOSIS — C61 Malignant neoplasm of prostate: Secondary | ICD-10-CM | POA: Diagnosis present

## 2024-02-25 ENCOUNTER — Other Ambulatory Visit: Payer: Self-pay

## 2024-02-25 DIAGNOSIS — C61 Malignant neoplasm of prostate: Secondary | ICD-10-CM | POA: Diagnosis not present

## 2024-02-25 LAB — RAD ONC ARIA SESSION SUMMARY
Course Elapsed Days: 0
Plan Fractions Treated to Date: 1
Plan Prescribed Dose Per Fraction: 1.8 Gy
Plan Total Fractions Prescribed: 25
Plan Total Prescribed Dose: 45 Gy
Reference Point Dosage Given to Date: 1.8 Gy
Reference Point Session Dosage Given: 1.8 Gy
Session Number: 1

## 2024-02-26 ENCOUNTER — Other Ambulatory Visit: Payer: Self-pay

## 2024-02-26 ENCOUNTER — Ambulatory Visit
Admission: RE | Admit: 2024-02-26 | Discharge: 2024-02-26 | Disposition: A | Source: Ambulatory Visit | Attending: Radiation Oncology

## 2024-02-26 DIAGNOSIS — C61 Malignant neoplasm of prostate: Secondary | ICD-10-CM | POA: Diagnosis not present

## 2024-02-26 LAB — RAD ONC ARIA SESSION SUMMARY
Course Elapsed Days: 1
Plan Fractions Treated to Date: 2
Plan Prescribed Dose Per Fraction: 1.8 Gy
Plan Total Fractions Prescribed: 25
Plan Total Prescribed Dose: 45 Gy
Reference Point Dosage Given to Date: 3.6 Gy
Reference Point Session Dosage Given: 1.8 Gy
Session Number: 2

## 2024-02-27 ENCOUNTER — Other Ambulatory Visit: Payer: Self-pay

## 2024-02-27 ENCOUNTER — Ambulatory Visit
Admission: RE | Admit: 2024-02-27 | Discharge: 2024-02-27 | Disposition: A | Discharge status: Home / Self Care | Source: Ambulatory Visit | Attending: Radiation Oncology | Admitting: Radiation Oncology

## 2024-02-27 DIAGNOSIS — C61 Malignant neoplasm of prostate: Secondary | ICD-10-CM | POA: Diagnosis not present

## 2024-02-27 LAB — RAD ONC ARIA SESSION SUMMARY
Course Elapsed Days: 2
Plan Fractions Treated to Date: 3
Plan Prescribed Dose Per Fraction: 1.8 Gy
Plan Total Fractions Prescribed: 25
Plan Total Prescribed Dose: 45 Gy
Reference Point Dosage Given to Date: 5.4 Gy
Reference Point Session Dosage Given: 1.8 Gy
Session Number: 3

## 2024-03-01 ENCOUNTER — Ambulatory Visit
Admission: RE | Admit: 2024-03-01 | Discharge: 2024-03-01 | Disposition: A | Source: Ambulatory Visit | Attending: Radiation Oncology

## 2024-03-01 ENCOUNTER — Other Ambulatory Visit: Payer: Self-pay

## 2024-03-01 DIAGNOSIS — C61 Malignant neoplasm of prostate: Secondary | ICD-10-CM | POA: Diagnosis not present

## 2024-03-01 LAB — RAD ONC ARIA SESSION SUMMARY
Course Elapsed Days: 5
Plan Fractions Treated to Date: 4
Plan Prescribed Dose Per Fraction: 1.8 Gy
Plan Total Fractions Prescribed: 25
Plan Total Prescribed Dose: 45 Gy
Reference Point Dosage Given to Date: 7.2 Gy
Reference Point Session Dosage Given: 1.8 Gy
Session Number: 4

## 2024-03-02 ENCOUNTER — Other Ambulatory Visit: Payer: Self-pay

## 2024-03-02 ENCOUNTER — Ambulatory Visit
Admission: RE | Admit: 2024-03-02 | Discharge: 2024-03-02 | Disposition: A | Source: Ambulatory Visit | Attending: Radiation Oncology

## 2024-03-02 DIAGNOSIS — C61 Malignant neoplasm of prostate: Secondary | ICD-10-CM | POA: Diagnosis not present

## 2024-03-02 LAB — RAD ONC ARIA SESSION SUMMARY
Course Elapsed Days: 6
Plan Fractions Treated to Date: 5
Plan Prescribed Dose Per Fraction: 1.8 Gy
Plan Total Fractions Prescribed: 25
Plan Total Prescribed Dose: 45 Gy
Reference Point Dosage Given to Date: 9 Gy
Reference Point Session Dosage Given: 1.8 Gy
Session Number: 5

## 2024-03-03 ENCOUNTER — Other Ambulatory Visit: Payer: Self-pay

## 2024-03-03 ENCOUNTER — Ambulatory Visit
Admission: RE | Admit: 2024-03-03 | Discharge: 2024-03-03 | Disposition: A | Source: Ambulatory Visit | Attending: Radiation Oncology

## 2024-03-03 DIAGNOSIS — C61 Malignant neoplasm of prostate: Secondary | ICD-10-CM | POA: Diagnosis not present

## 2024-03-03 LAB — RAD ONC ARIA SESSION SUMMARY
Course Elapsed Days: 7
Plan Fractions Treated to Date: 6
Plan Prescribed Dose Per Fraction: 1.8 Gy
Plan Total Fractions Prescribed: 25
Plan Total Prescribed Dose: 45 Gy
Reference Point Dosage Given to Date: 10.8 Gy
Reference Point Session Dosage Given: 1.8 Gy
Session Number: 6

## 2024-03-04 ENCOUNTER — Other Ambulatory Visit: Payer: Self-pay

## 2024-03-04 ENCOUNTER — Ambulatory Visit
Admission: RE | Admit: 2024-03-04 | Discharge: 2024-03-04 | Disposition: A | Discharge status: Home / Self Care | Source: Ambulatory Visit | Attending: Radiation Oncology | Admitting: Radiation Oncology

## 2024-03-04 DIAGNOSIS — C61 Malignant neoplasm of prostate: Secondary | ICD-10-CM | POA: Diagnosis not present

## 2024-03-04 LAB — RAD ONC ARIA SESSION SUMMARY
Course Elapsed Days: 8
Plan Fractions Treated to Date: 7
Plan Prescribed Dose Per Fraction: 1.8 Gy
Plan Total Fractions Prescribed: 25
Plan Total Prescribed Dose: 45 Gy
Reference Point Dosage Given to Date: 12.6 Gy
Reference Point Session Dosage Given: 1.8 Gy
Session Number: 7

## 2024-03-05 ENCOUNTER — Other Ambulatory Visit: Payer: Self-pay

## 2024-03-05 ENCOUNTER — Other Ambulatory Visit: Payer: Self-pay | Admitting: Radiation Oncology

## 2024-03-05 ENCOUNTER — Ambulatory Visit
Admission: RE | Admit: 2024-03-05 | Discharge: 2024-03-05 | Disposition: A | Source: Ambulatory Visit | Attending: Radiation Oncology

## 2024-03-05 DIAGNOSIS — C61 Malignant neoplasm of prostate: Secondary | ICD-10-CM | POA: Diagnosis not present

## 2024-03-05 LAB — RAD ONC ARIA SESSION SUMMARY
Course Elapsed Days: 9
Plan Fractions Treated to Date: 8
Plan Prescribed Dose Per Fraction: 1.8 Gy
Plan Total Fractions Prescribed: 25
Plan Total Prescribed Dose: 45 Gy
Reference Point Dosage Given to Date: 14.4 Gy
Reference Point Session Dosage Given: 1.8 Gy
Session Number: 8

## 2024-03-08 ENCOUNTER — Ambulatory Visit

## 2024-03-09 ENCOUNTER — Ambulatory Visit

## 2024-03-10 ENCOUNTER — Ambulatory Visit
Admission: RE | Admit: 2024-03-10 | Discharge: 2024-03-10 | Disposition: A | Source: Ambulatory Visit | Attending: Radiation Oncology

## 2024-03-10 ENCOUNTER — Other Ambulatory Visit: Payer: Self-pay

## 2024-03-10 LAB — RAD ONC ARIA SESSION SUMMARY
Course Elapsed Days: 14
Plan Fractions Treated to Date: 9
Plan Prescribed Dose Per Fraction: 1.8 Gy
Plan Total Fractions Prescribed: 25
Plan Total Prescribed Dose: 45 Gy
Reference Point Dosage Given to Date: 16.2 Gy
Reference Point Session Dosage Given: 1.8 Gy
Session Number: 9

## 2024-03-11 ENCOUNTER — Other Ambulatory Visit: Payer: Self-pay

## 2024-03-11 ENCOUNTER — Ambulatory Visit
Admission: RE | Admit: 2024-03-11 | Discharge: 2024-03-11 | Disposition: A | Discharge status: Home / Self Care | Source: Ambulatory Visit | Attending: Radiation Oncology | Admitting: Radiation Oncology

## 2024-03-11 LAB — RAD ONC ARIA SESSION SUMMARY
Course Elapsed Days: 15
Plan Fractions Treated to Date: 10
Plan Prescribed Dose Per Fraction: 1.8 Gy
Plan Total Fractions Prescribed: 25
Plan Total Prescribed Dose: 45 Gy
Reference Point Dosage Given to Date: 18 Gy
Reference Point Session Dosage Given: 1.8 Gy
Session Number: 10

## 2024-03-12 ENCOUNTER — Other Ambulatory Visit: Payer: Self-pay

## 2024-03-12 ENCOUNTER — Ambulatory Visit
Admission: RE | Admit: 2024-03-12 | Discharge: 2024-03-12 | Disposition: A | Source: Ambulatory Visit | Attending: Radiation Oncology

## 2024-03-12 LAB — RAD ONC ARIA SESSION SUMMARY
Course Elapsed Days: 16
Plan Fractions Treated to Date: 11
Plan Prescribed Dose Per Fraction: 1.8 Gy
Plan Total Fractions Prescribed: 25
Plan Total Prescribed Dose: 45 Gy
Reference Point Dosage Given to Date: 19.8 Gy
Reference Point Session Dosage Given: 1.8 Gy
Session Number: 11

## 2024-03-15 ENCOUNTER — Ambulatory Visit

## 2024-03-16 ENCOUNTER — Other Ambulatory Visit: Payer: Self-pay

## 2024-03-16 ENCOUNTER — Ambulatory Visit
Admission: RE | Admit: 2024-03-16 | Discharge: 2024-03-16 | Disposition: A | Source: Ambulatory Visit | Attending: Radiation Oncology

## 2024-03-16 LAB — RAD ONC ARIA SESSION SUMMARY
Course Elapsed Days: 20
Plan Fractions Treated to Date: 12
Plan Prescribed Dose Per Fraction: 1.8 Gy
Plan Total Fractions Prescribed: 25
Plan Total Prescribed Dose: 45 Gy
Reference Point Dosage Given to Date: 21.6 Gy
Reference Point Session Dosage Given: 1.8 Gy
Session Number: 12

## 2024-03-17 ENCOUNTER — Ambulatory Visit
Admission: RE | Admit: 2024-03-17 | Discharge: 2024-03-17 | Disposition: A | Source: Ambulatory Visit | Attending: Radiation Oncology

## 2024-03-17 ENCOUNTER — Other Ambulatory Visit: Payer: Self-pay

## 2024-03-17 LAB — RAD ONC ARIA SESSION SUMMARY
Course Elapsed Days: 21
Plan Fractions Treated to Date: 13
Plan Prescribed Dose Per Fraction: 1.8 Gy
Plan Total Fractions Prescribed: 25
Plan Total Prescribed Dose: 45 Gy
Reference Point Dosage Given to Date: 23.4 Gy
Reference Point Session Dosage Given: 1.8 Gy
Session Number: 13

## 2024-03-18 ENCOUNTER — Ambulatory Visit
Admission: RE | Admit: 2024-03-18 | Discharge: 2024-03-18 | Disposition: A | Source: Ambulatory Visit | Attending: Radiation Oncology

## 2024-03-18 ENCOUNTER — Other Ambulatory Visit: Payer: Self-pay

## 2024-03-18 LAB — RAD ONC ARIA SESSION SUMMARY
Course Elapsed Days: 22
Plan Fractions Treated to Date: 14
Plan Prescribed Dose Per Fraction: 1.8 Gy
Plan Total Fractions Prescribed: 25
Plan Total Prescribed Dose: 45 Gy
Reference Point Dosage Given to Date: 25.2 Gy
Reference Point Session Dosage Given: 1.8 Gy
Session Number: 14

## 2024-03-19 ENCOUNTER — Ambulatory Visit

## 2024-03-22 ENCOUNTER — Ambulatory Visit

## 2024-03-23 ENCOUNTER — Ambulatory Visit

## 2024-03-24 ENCOUNTER — Ambulatory Visit

## 2024-03-25 ENCOUNTER — Ambulatory Visit

## 2024-03-26 ENCOUNTER — Ambulatory Visit

## 2024-03-29 ENCOUNTER — Ambulatory Visit

## 2024-03-30 ENCOUNTER — Ambulatory Visit

## 2024-03-31 ENCOUNTER — Ambulatory Visit

## 2024-04-01 ENCOUNTER — Ambulatory Visit

## 2024-04-02 ENCOUNTER — Ambulatory Visit

## 2024-04-05 ENCOUNTER — Ambulatory Visit

## 2024-04-06 ENCOUNTER — Ambulatory Visit

## 2024-04-07 ENCOUNTER — Ambulatory Visit

## 2024-04-08 ENCOUNTER — Ambulatory Visit

## 2024-04-09 ENCOUNTER — Ambulatory Visit

## 2024-04-12 ENCOUNTER — Ambulatory Visit

## 2024-04-13 ENCOUNTER — Ambulatory Visit

## 2024-04-14 ENCOUNTER — Ambulatory Visit

## 2024-04-15 ENCOUNTER — Ambulatory Visit

## 2024-04-16 ENCOUNTER — Ambulatory Visit

## 2024-04-19 ENCOUNTER — Ambulatory Visit

## 2024-04-20 ENCOUNTER — Ambulatory Visit

## 2024-04-21 ENCOUNTER — Ambulatory Visit

## 2024-04-22 ENCOUNTER — Ambulatory Visit

## 2024-04-23 ENCOUNTER — Ambulatory Visit

## 2024-04-26 ENCOUNTER — Ambulatory Visit
